# Patient Record
Sex: Female | Born: 1965 | Race: Black or African American | Hispanic: No | Marital: Married | Smoking: Current some day smoker
Health system: Southern US, Community
[De-identification: ages and names within clinical notes are randomized; demographics above are authoritative.]

## PROBLEM LIST (undated history)

## (undated) DIAGNOSIS — I209 Angina pectoris, unspecified: Secondary | ICD-10-CM

## (undated) DIAGNOSIS — F329 Major depressive disorder, single episode, unspecified: Secondary | ICD-10-CM

## (undated) DIAGNOSIS — E785 Hyperlipidemia, unspecified: Secondary | ICD-10-CM

## (undated) DIAGNOSIS — R519 Headache, unspecified: Secondary | ICD-10-CM

## (undated) DIAGNOSIS — R51 Headache: Secondary | ICD-10-CM

## (undated) DIAGNOSIS — M5136 Other intervertebral disc degeneration, lumbar region: Secondary | ICD-10-CM

## (undated) DIAGNOSIS — R0600 Dyspnea, unspecified: Secondary | ICD-10-CM

## (undated) DIAGNOSIS — C561 Malignant neoplasm of right ovary: Secondary | ICD-10-CM

## (undated) DIAGNOSIS — J45909 Unspecified asthma, uncomplicated: Secondary | ICD-10-CM

## (undated) DIAGNOSIS — F419 Anxiety disorder, unspecified: Secondary | ICD-10-CM

## (undated) DIAGNOSIS — K219 Gastro-esophageal reflux disease without esophagitis: Secondary | ICD-10-CM

## (undated) DIAGNOSIS — I219 Acute myocardial infarction, unspecified: Secondary | ICD-10-CM

## (undated) DIAGNOSIS — M1990 Unspecified osteoarthritis, unspecified site: Secondary | ICD-10-CM

## (undated) DIAGNOSIS — I10 Essential (primary) hypertension: Secondary | ICD-10-CM

## (undated) DIAGNOSIS — M109 Gout, unspecified: Secondary | ICD-10-CM

## (undated) DIAGNOSIS — R7303 Prediabetes: Secondary | ICD-10-CM

## (undated) DIAGNOSIS — F32A Depression, unspecified: Secondary | ICD-10-CM

## (undated) DIAGNOSIS — K760 Fatty (change of) liver, not elsewhere classified: Secondary | ICD-10-CM

## (undated) DIAGNOSIS — K449 Diaphragmatic hernia without obstruction or gangrene: Secondary | ICD-10-CM

## (undated) DIAGNOSIS — M51369 Other intervertebral disc degeneration, lumbar region without mention of lumbar back pain or lower extremity pain: Secondary | ICD-10-CM

## (undated) DIAGNOSIS — Z87442 Personal history of urinary calculi: Secondary | ICD-10-CM

## (undated) DIAGNOSIS — K644 Residual hemorrhoidal skin tags: Secondary | ICD-10-CM

## (undated) DIAGNOSIS — R0609 Other forms of dyspnea: Secondary | ICD-10-CM

## (undated) DIAGNOSIS — R7989 Other specified abnormal findings of blood chemistry: Secondary | ICD-10-CM

## (undated) DIAGNOSIS — R001 Bradycardia, unspecified: Secondary | ICD-10-CM

## (undated) DIAGNOSIS — Z87898 Personal history of other specified conditions: Secondary | ICD-10-CM

## (undated) HISTORY — PX: CARDIAC CATHETERIZATION: SHX172

## (undated) HISTORY — PX: LAPAROSCOPIC REMOVAL ABDOMINAL MASS: SHX6360

## (undated) HISTORY — PX: ABDOMINAL HYSTERECTOMY: SHX81

---

## 2007-07-09 ENCOUNTER — Ambulatory Visit: Payer: Self-pay | Admitting: Family Medicine

## 2007-07-09 IMAGING — US ABDOMEN ULTRASOUND
1 series · 17 of 25 positions shown · non-contrast
Comparison: none

REASON FOR EXAM: Abd Pain RUQ
COMMENTS:

PROCEDURE:     US  - US ABDOMEN GENERAL SURVEY  - [DATE]  [DATE]
RESULT:     Comparison: No available comparison exam.

[Series 1: abdomen ultrasound · 17 of 57 slices shown]
[im 1/57]
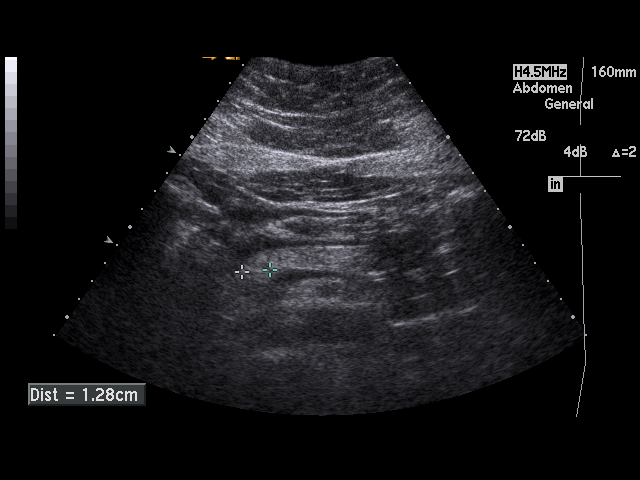
[im 5/57]
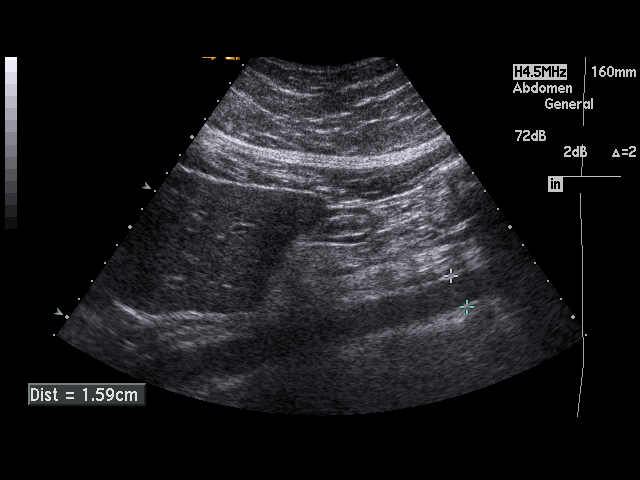
[im 8/57]
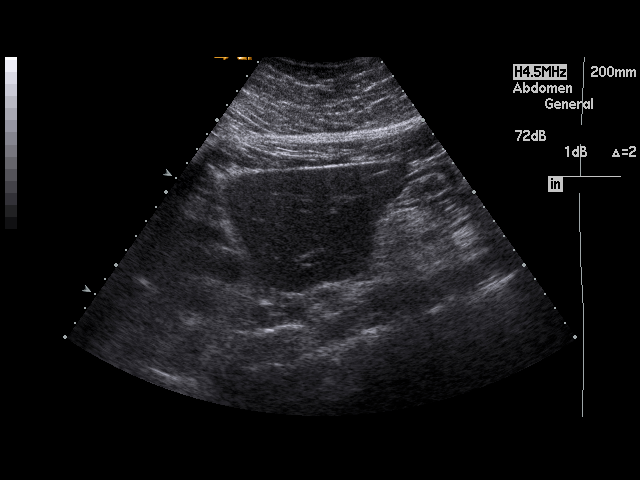
[im 12/57]
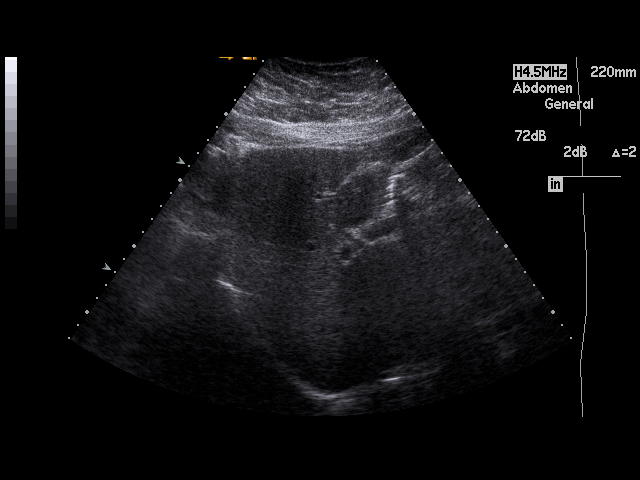
[im 15/57]
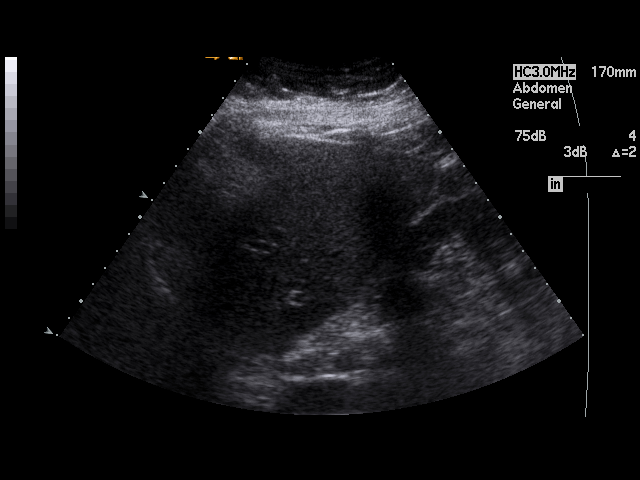
[im 19/57]
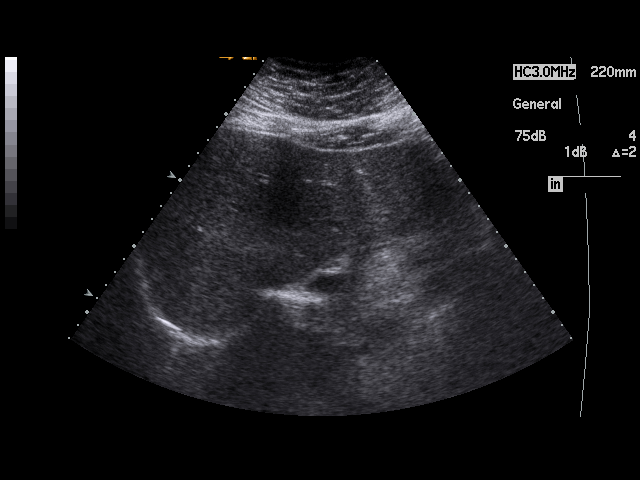
[im 22/57]
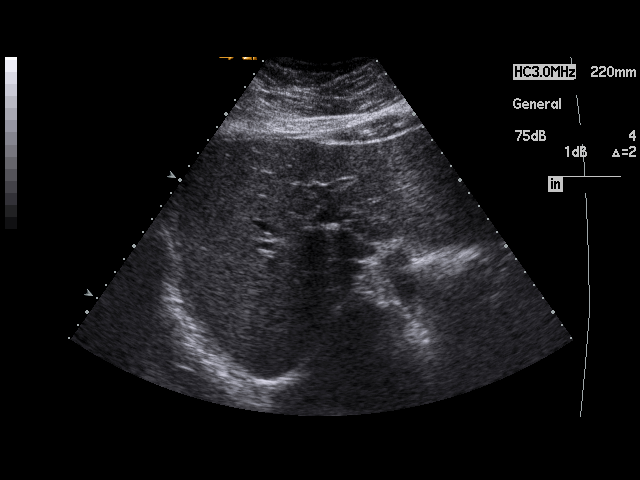
[im 26/57]
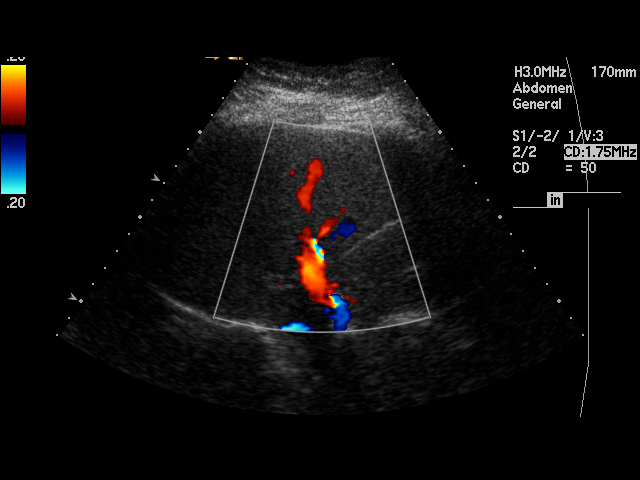
[im 29/57]
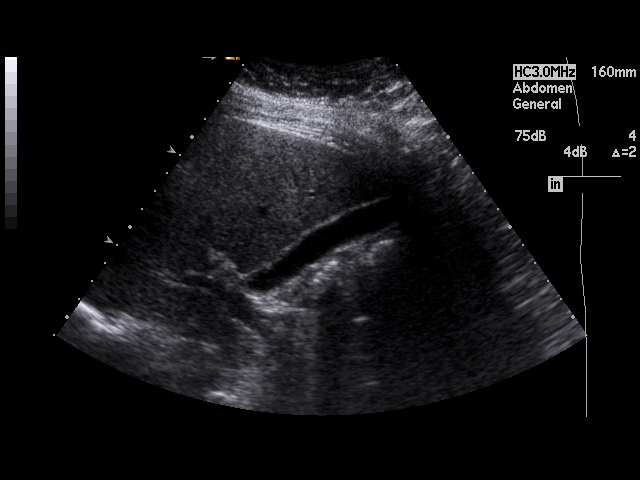
[im 31/57]
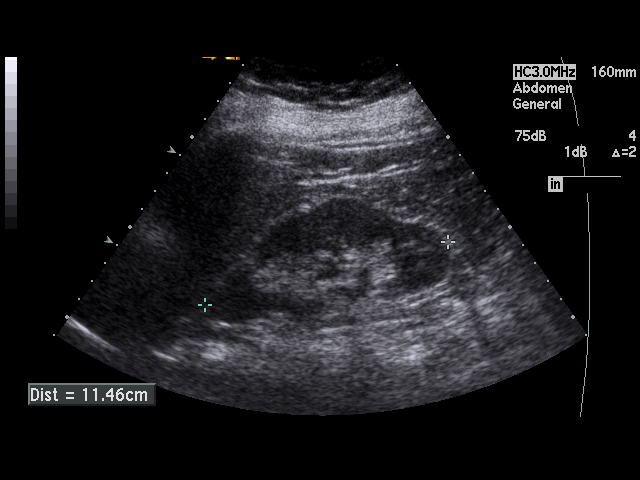
[im 36/57]
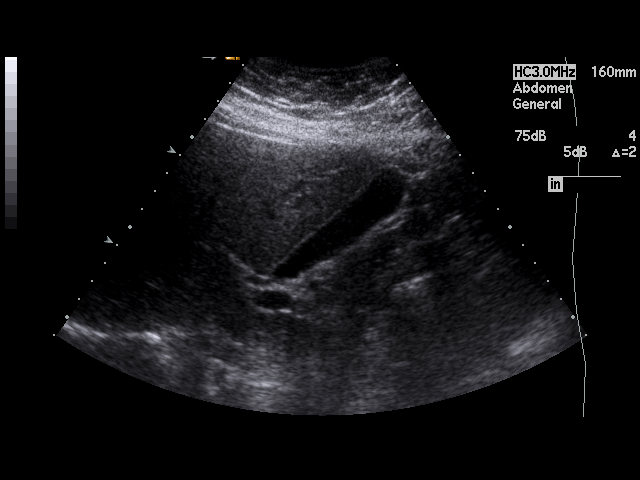
[im 38/57]
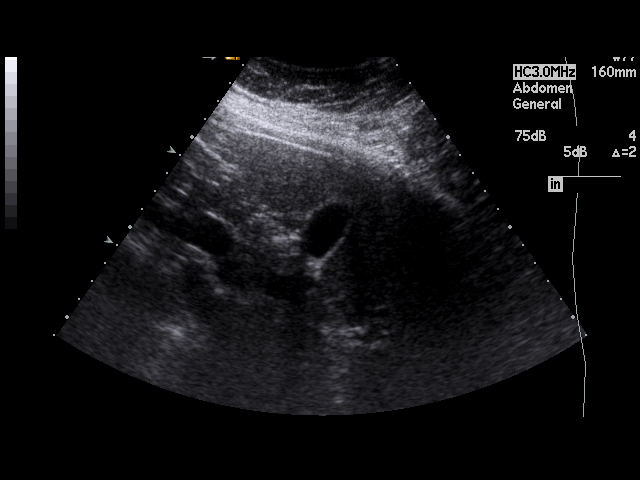
[im 43/57]
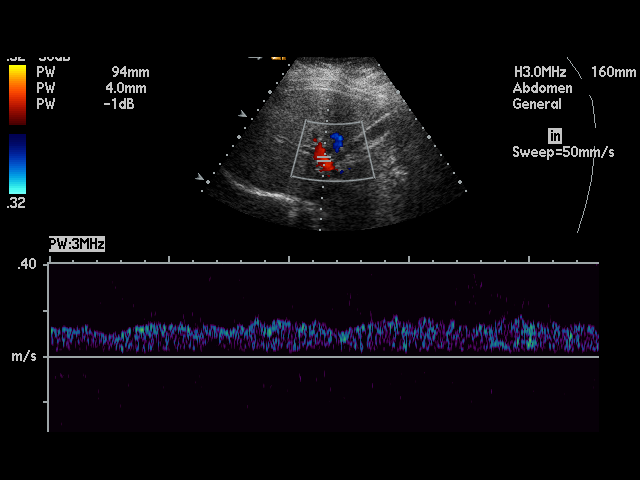
[im 45/57]
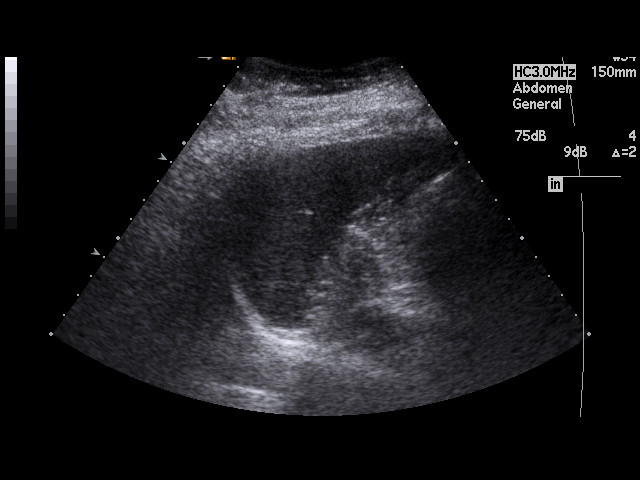
[im 50/57]
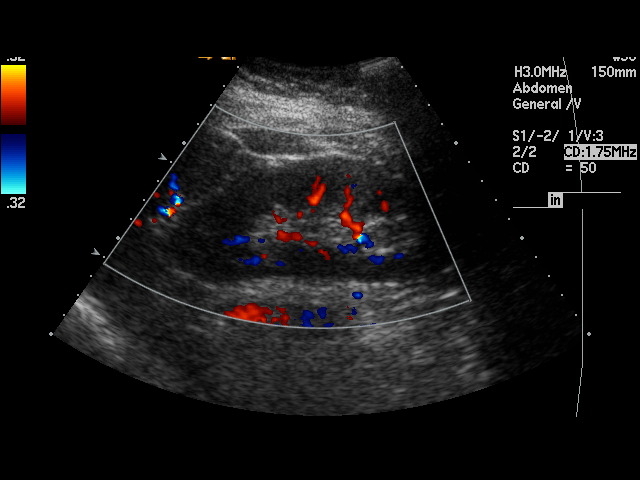
[im 52/57]
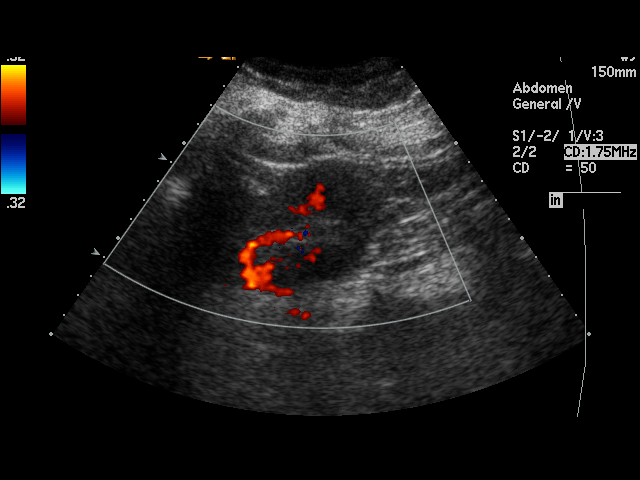
[im 57/57]
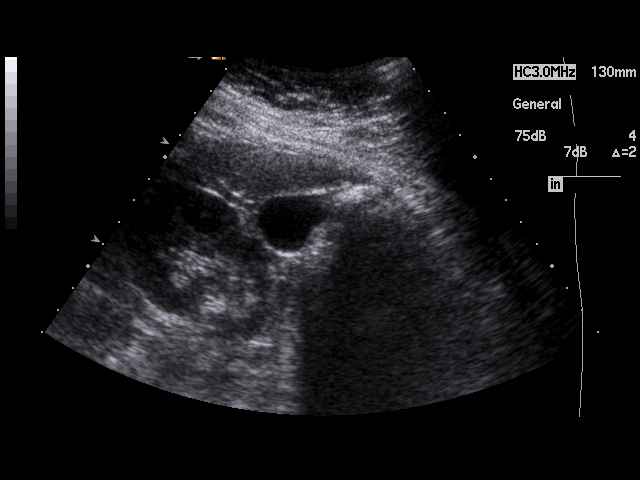

[17 of 25 positions shown; findings below may reference images not displayed]

FINDINGS: The liver is unremarkable without evidence of a focal mass. There is no
intra or extrahepatic bile duct dilatation. The common duct measures 3 mm in
diameter. The gallbladder is unremarkable. There is no sonographic Murphy's
sign. The main portal vein is patent with hepatopedal flow.

The partially visualized pancreatic head and body are unremarkable. The
right kidney measures 11.5 cm and the left measures 11.7 cm in length. There
is normal corticomedullary differentiation. No definite renal stone or mass
is noted. There is no hydronephrosis. The spleen measures 10.6 cm in length.
No evidence of abdominal aortic aneurysm.
IMPRESSION: 1. The pancreas is not fully visualized. Otherwise, unremarkable abdominal
sonogram.

## 2008-01-04 ENCOUNTER — Emergency Department: Payer: Self-pay | Admitting: Emergency Medicine

## 2009-06-05 ENCOUNTER — Ambulatory Visit: Payer: Self-pay | Admitting: Internal Medicine

## 2009-06-05 IMAGING — CT CT ABD-PELV W/ CM
1 of 3 series · 12 of 32 positions shown, 18 images · IV contrast (isovue)
Comparison: None

REASON FOR EXAM: rt abd pain nausea lower back pain
COMMENTS:

PROCEDURE:     CT  - CT ABDOMEN / PELVIS  W  - [DATE]  [DATE]
RESULT:     History: Right abdominal pain
TECHNIQUE: Multiple axial images of the abdomen and pelvis were performed
from the lung bases to the pubic symphysis, with p.o. contrast and with 100
ml of Isovue 370 intravenous contrast.

[Series 2: abdomen · axial · 0.83mm/px · z∈[+139,+524]mm · 12 of 93 slices shown, 18 images]
[im 8/93  soft-tissue]
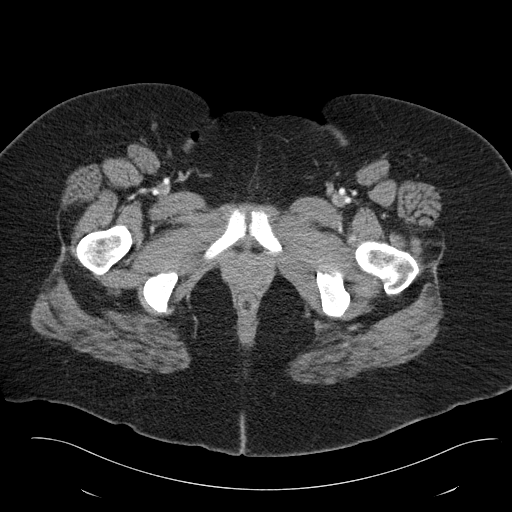
[im 8/93  bone]
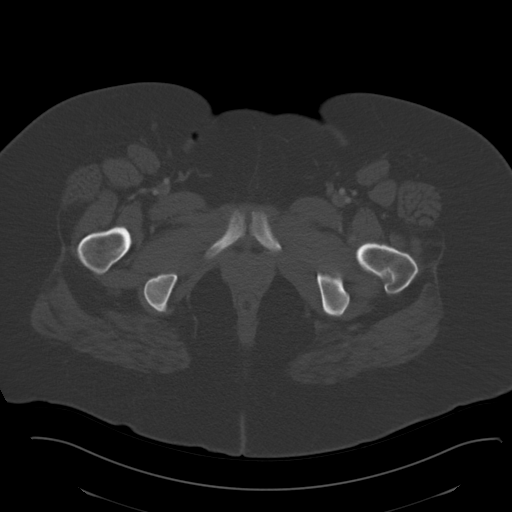
[im 15/93  soft-tissue]
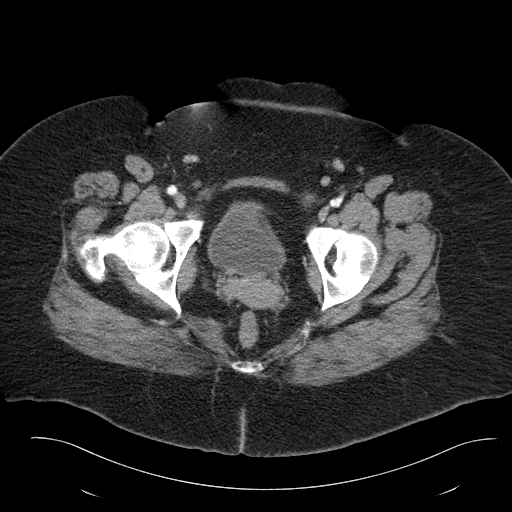
[im 22/93  soft-tissue]
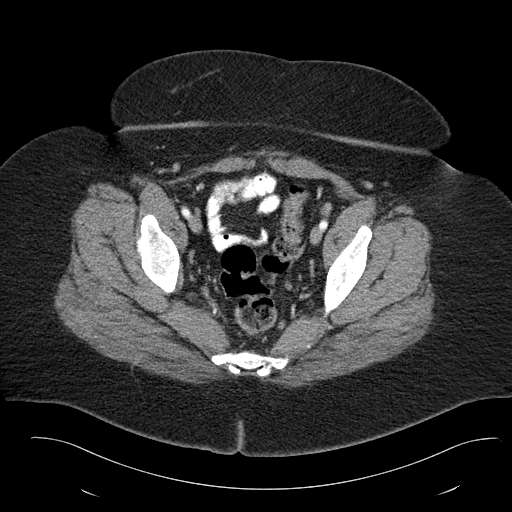
[im 29/93  soft-tissue]
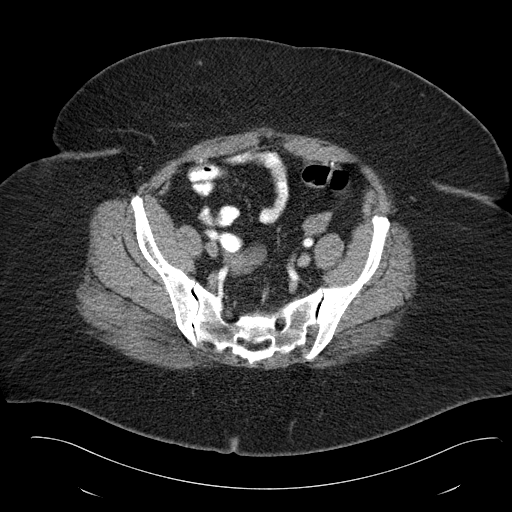
[im 36/93  soft-tissue]
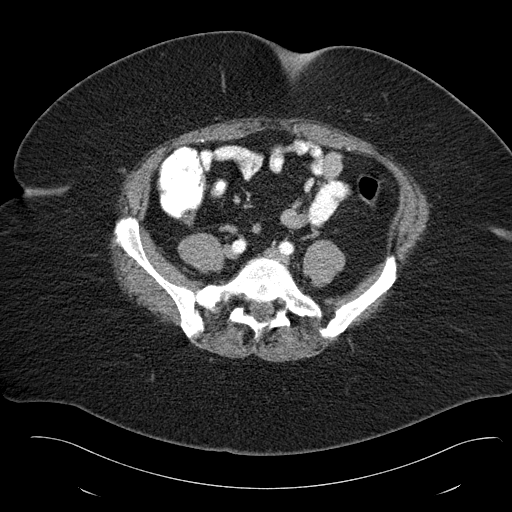
[im 43/93  soft-tissue]
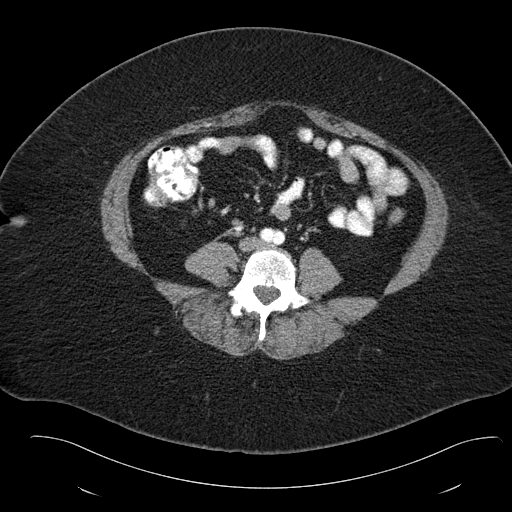
[im 50/93  soft-tissue]
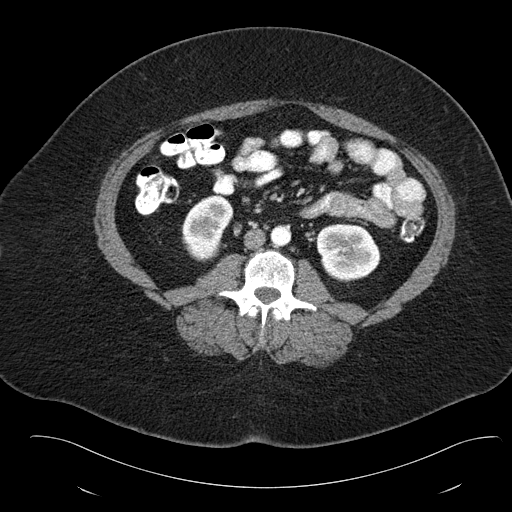
[im 57/93  soft-tissue]
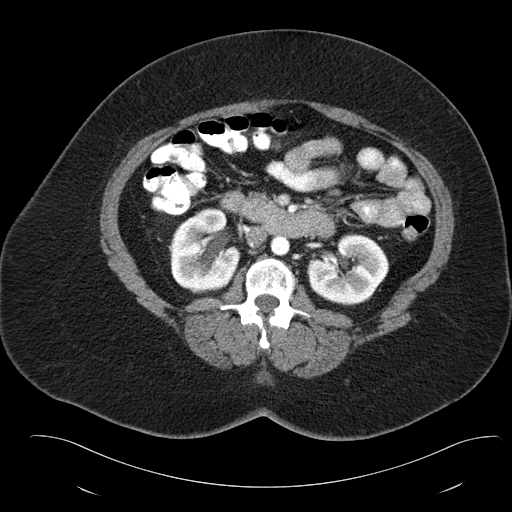
[im 64/93  soft-tissue]
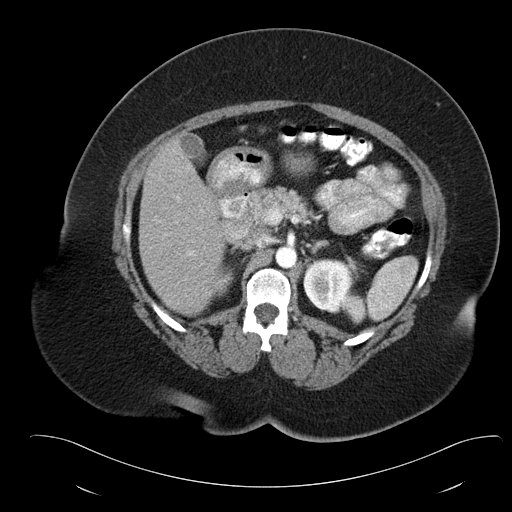
[im 64/93  lung]
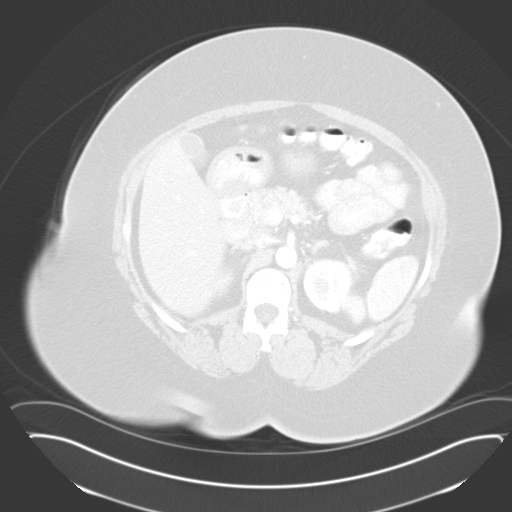
[im 64/93  bone]
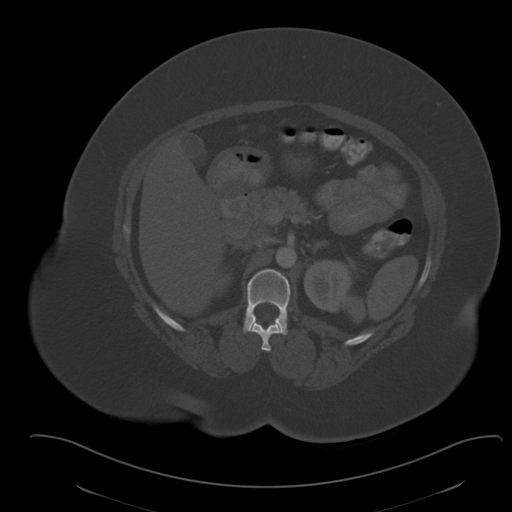
[im 71/93  soft-tissue]
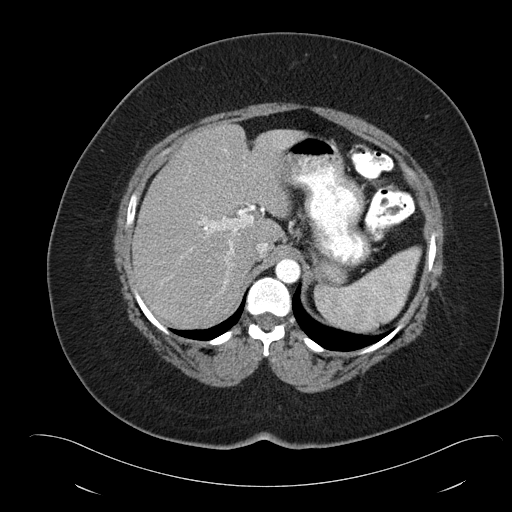
[im 71/93  lung]
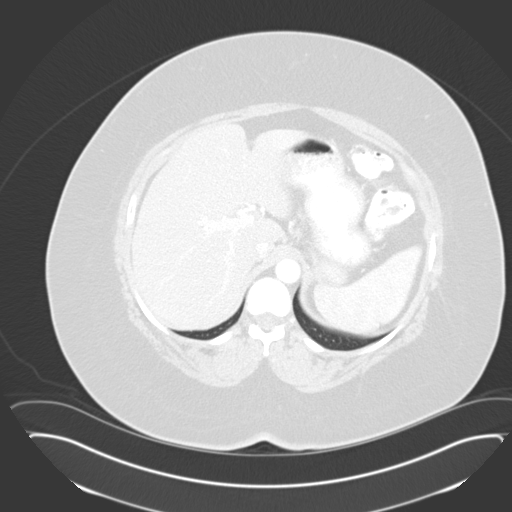
[im 78/93  soft-tissue]
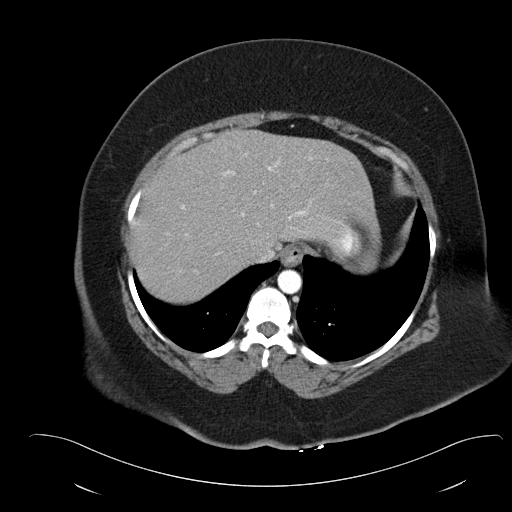
[im 78/93  lung]
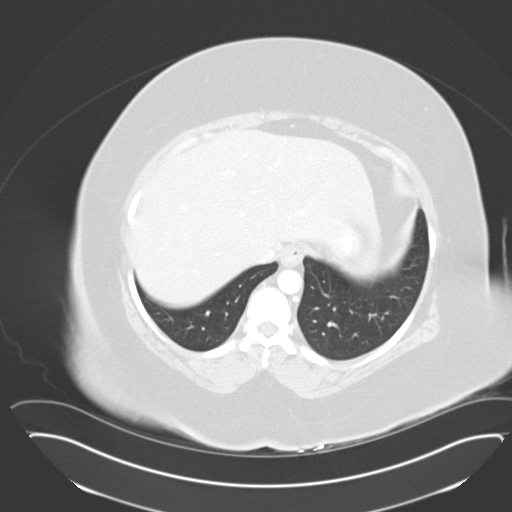
[im 85/93  soft-tissue]
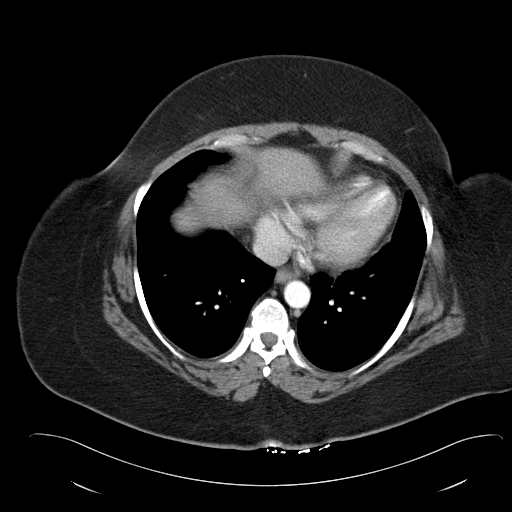
[im 85/93  lung]
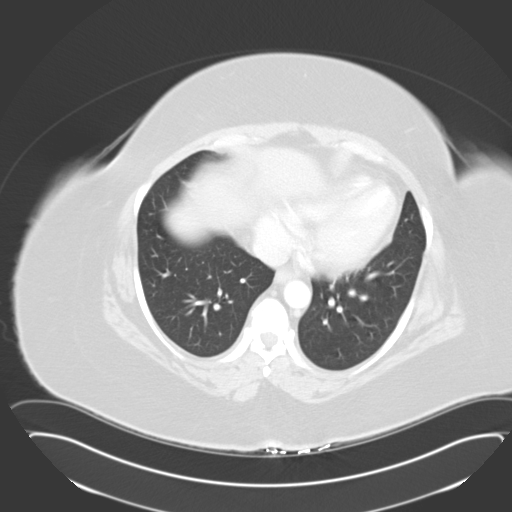

[12 of 32 positions shown; findings below may reference images not displayed]

FINDINGS: The lung bases are clear. There is no pneumothorax. The heart size is
normal.

The liver demonstrates no focal abnormality. There is no intrahepatic or
extrahepatic biliary ductal dilatation. The gallbladder is unremarkable. The
spleen demonstrates no focal abnormality. The kidneys, adrenal glands, and
pancreas are normal. The bladder is unremarkable. There is a nonobstructing
2 mm proximal right ureteral calculus.

The stomach, duodenum, small intestine, and large intestine demonstrate no
contrast extravasation or dilatation. There is a normal caliber appendix in
the right lower quadrant without periappendiceal inflammatory changes. There
is no pneumoperitoneum, pneumatosis, or portal venous gas. There is no
abdominal or pelvic free fluid. There is no lymphadenopathy.

The abdominal aorta is normal in caliber .

There is lumbar spine spondylosis.
IMPRESSION: 1. Nonobstructing 2 mm proximal right ureteral calculus.

## 2009-07-17 ENCOUNTER — Ambulatory Visit: Payer: Self-pay | Admitting: Unknown Physician Specialty

## 2009-11-21 ENCOUNTER — Ambulatory Visit: Payer: Self-pay | Admitting: Family Medicine

## 2009-11-21 IMAGING — MG MAM DGTL SCREENING MAMMO W/CAD
1 series · 4 of 4 positions shown · non-contrast
Comparison: none

REASON FOR EXAM: scr  unsure if baseline
COMMENTS:

PROCEDURE:     MAM - MAM DGTL SCREENING MAMMO W/CAD  - [DATE]  [DATE]
RESULT:      No dominant masses or pathologic clustered calcifications are
demonstrated.  Exam is stable from prior exams. CAD evaluation is nonfocal.

[R CC · right · 4 of 4 slices shown]
[im 1/4]
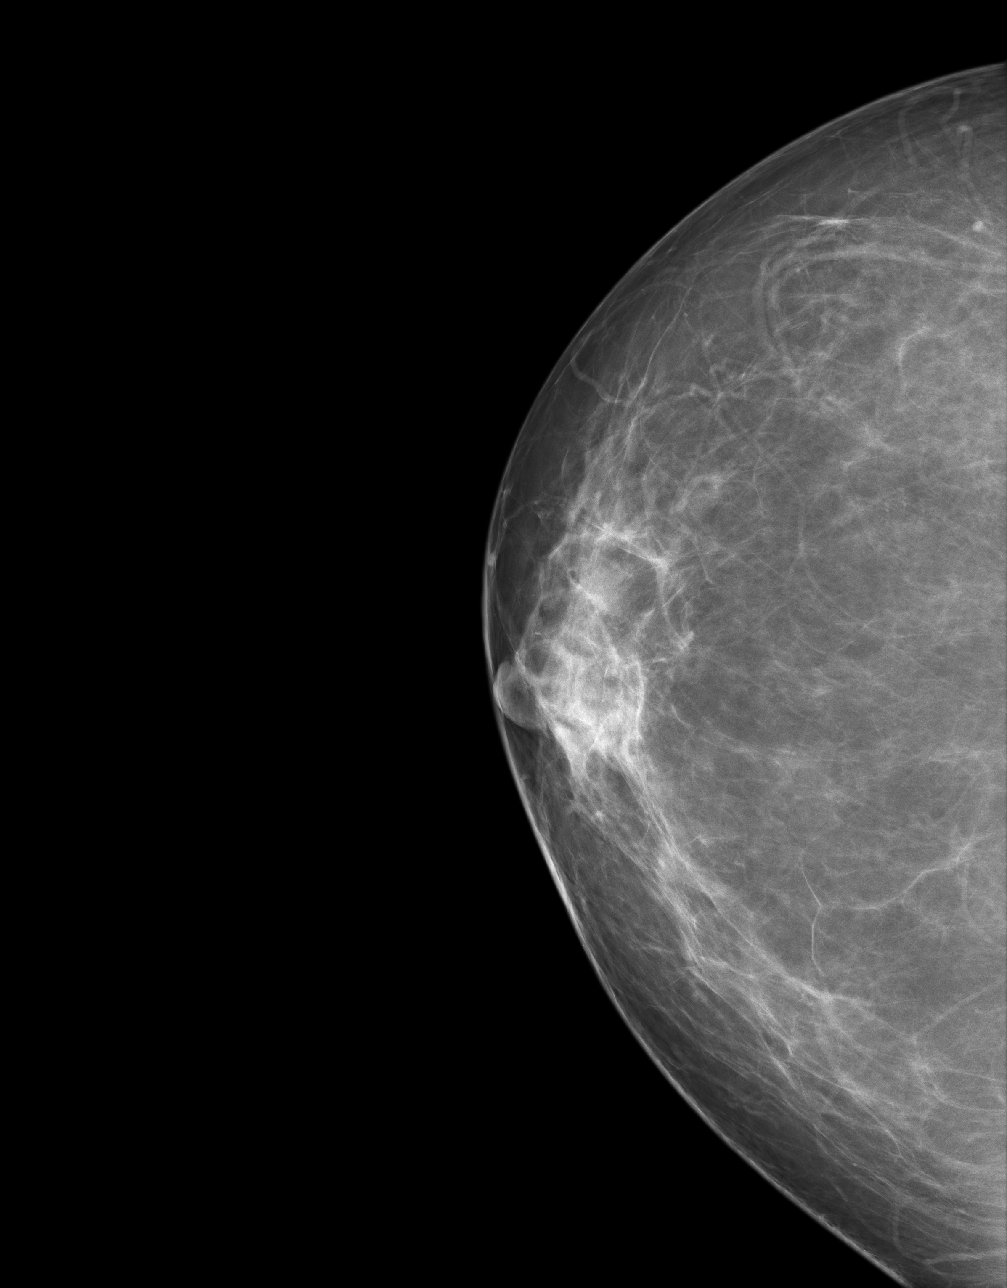
[im 2/4]
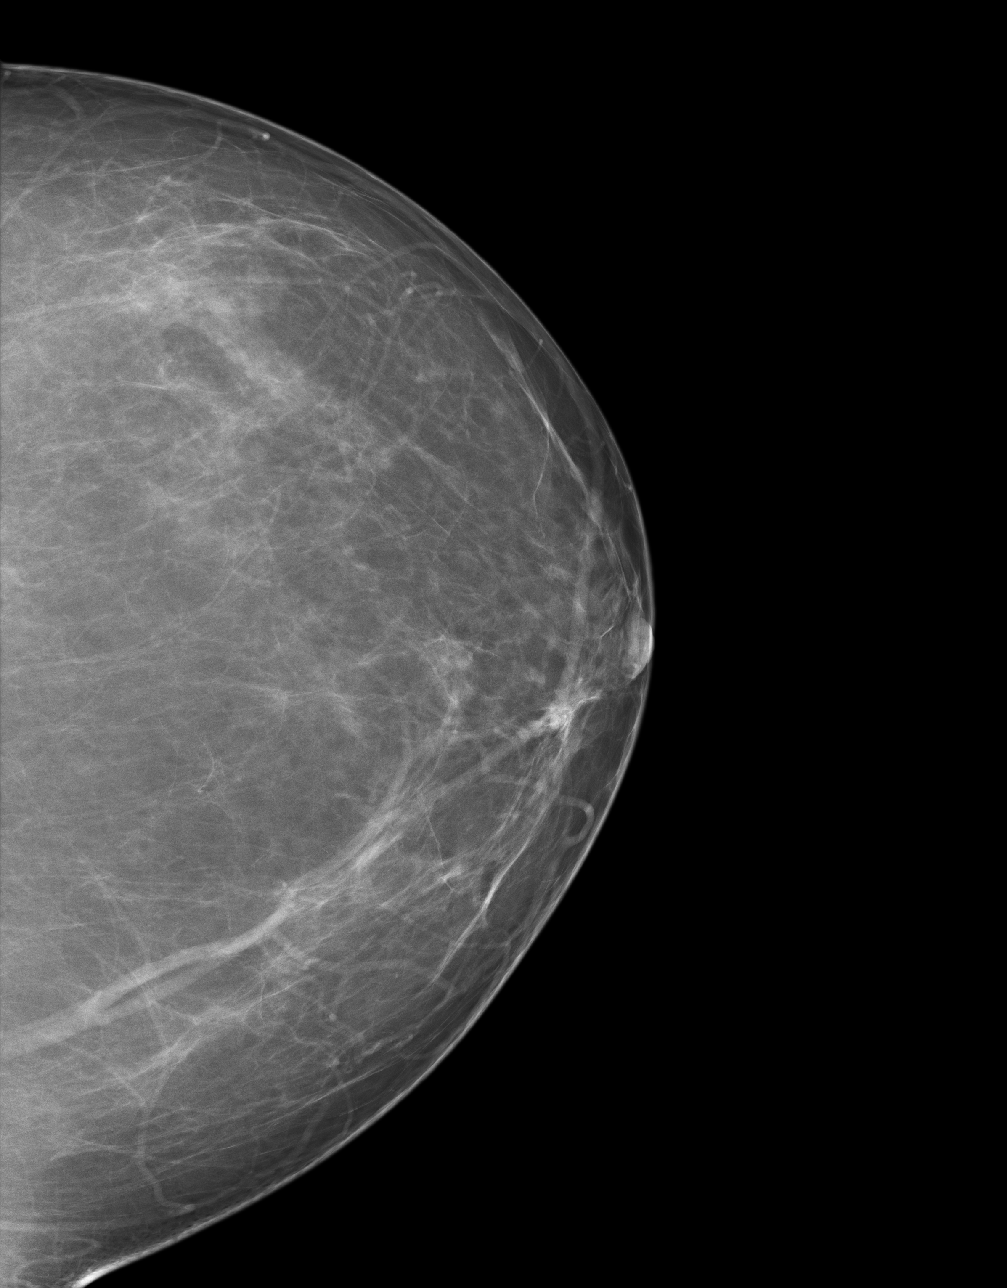
[im 3/4]
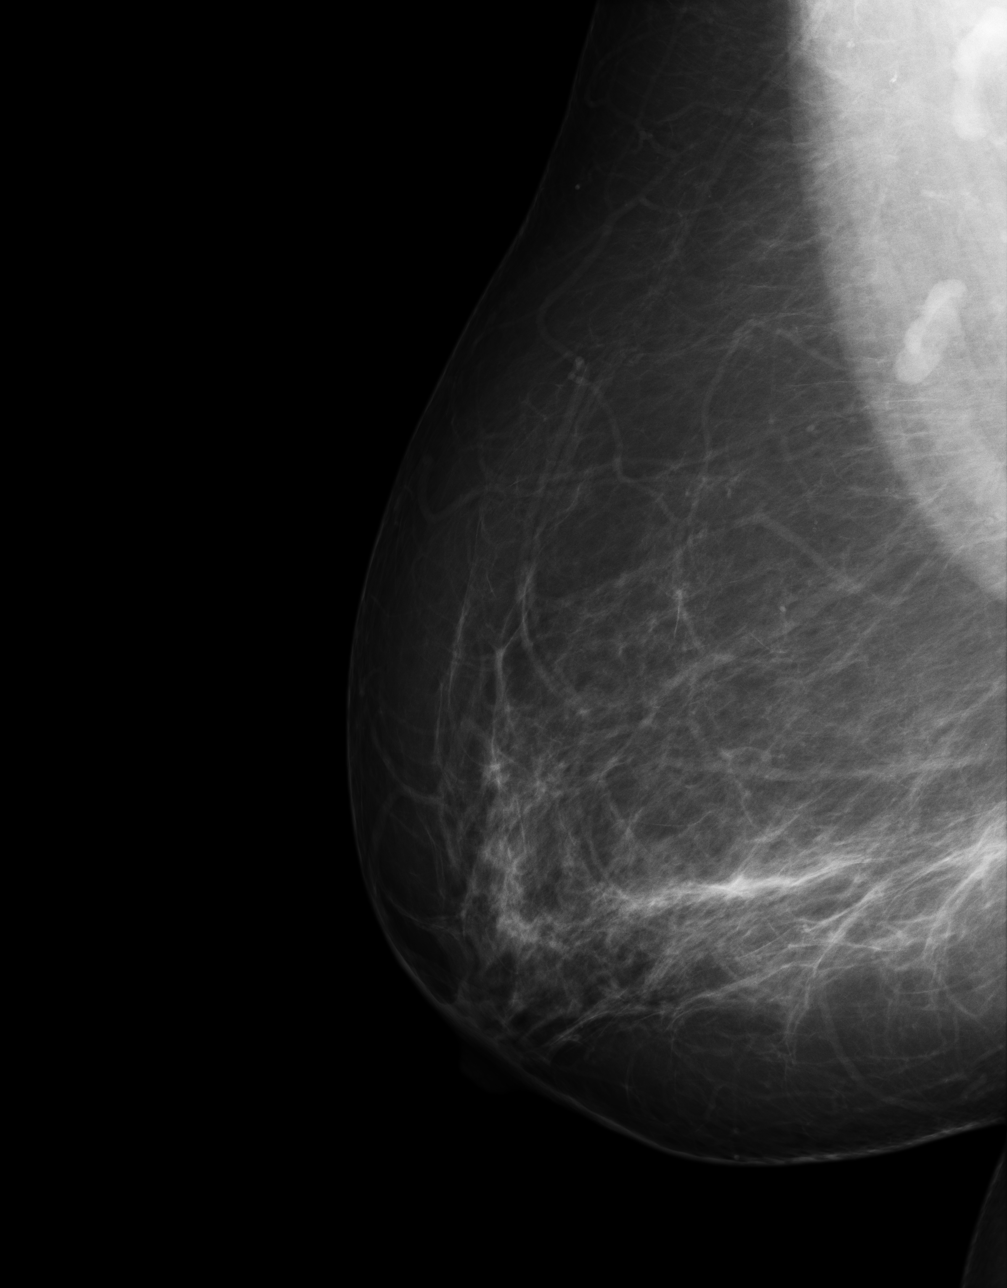
[im 4/4]
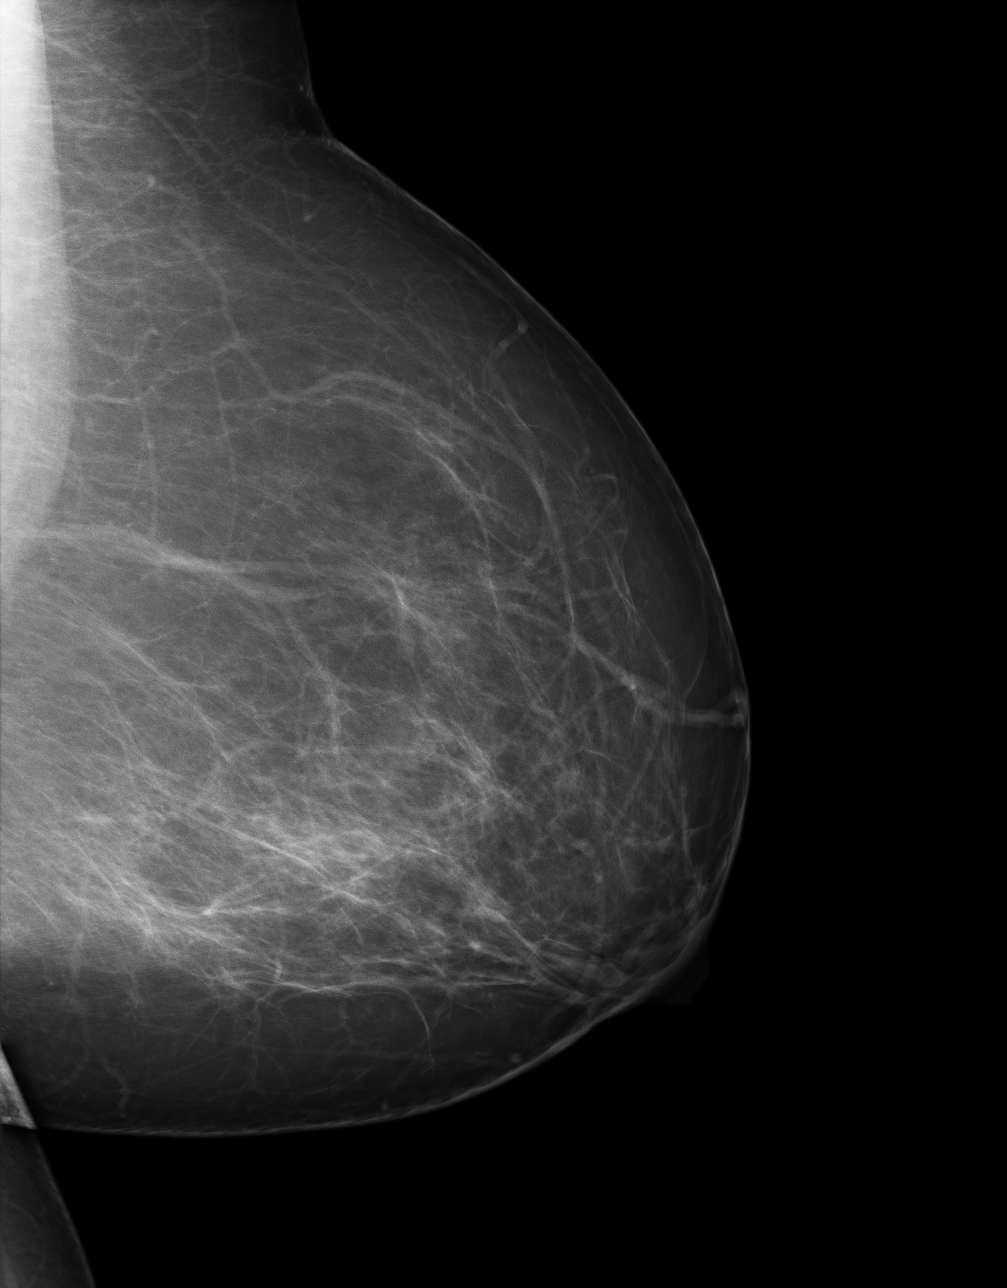

[4 of 4 positions shown; findings below may reference images not displayed]

IMPRESSION: 1.     Benign exam.
2.     Routine yearly follow up exam is suggested.
3.     BI-RADS:  Category 2- Benign Finding.

A negative mammogram report does not preclude biopsy or other evaluation of
a clinically palpable or otherwise suspicious mass or lesion. Breast cancer
may not be detected by mammography in up to 10% of cases.

## 2011-06-19 DIAGNOSIS — R7989 Other specified abnormal findings of blood chemistry: Secondary | ICD-10-CM | POA: Insufficient documentation

## 2011-07-18 ENCOUNTER — Ambulatory Visit: Payer: Self-pay | Admitting: General Surgery

## 2011-07-18 DIAGNOSIS — I10 Essential (primary) hypertension: Secondary | ICD-10-CM

## 2011-07-21 LAB — PATHOLOGY REPORT

## 2011-12-16 ENCOUNTER — Ambulatory Visit: Payer: Self-pay | Admitting: Family Medicine

## 2011-12-16 IMAGING — MG MM DIGITAL SCREENING BILAT W/ CAD
1 series · 8 of 8 positions shown · non-contrast
Comparison: none

REASON FOR EXAM: scr mammo no order
COMMENTS:

PROCEDURE:     MMM - MMM DGT SCR NO ORDER W/CAD  - [DATE]  [DATE]
RESULT:
Comparisons: [DATE], mammograms from [REDACTED]
[DATE].

[R CC · right · 8 of 8 slices shown]
[im 1/8]
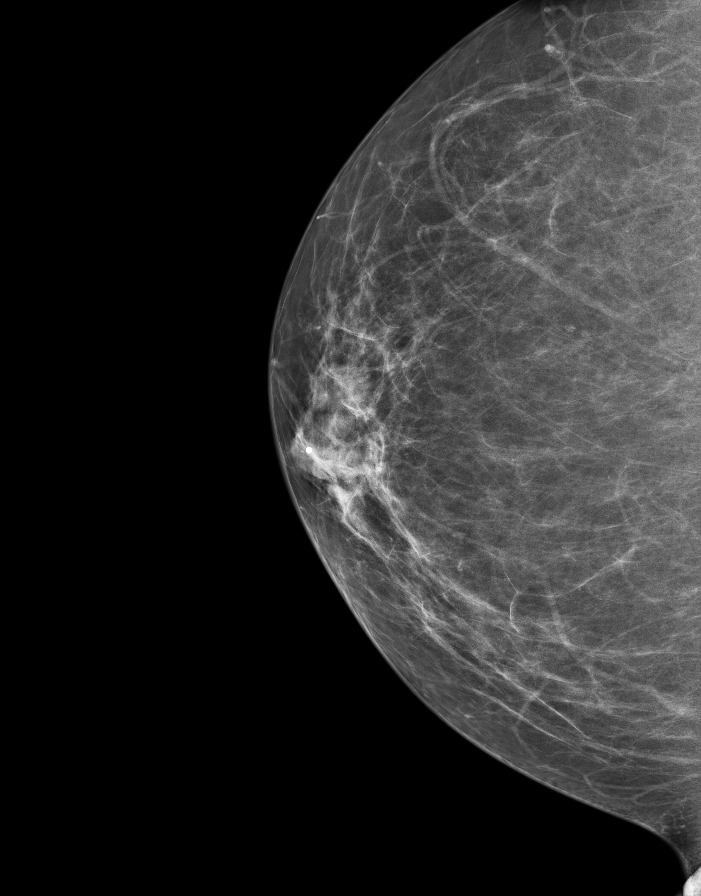
[im 2/8]
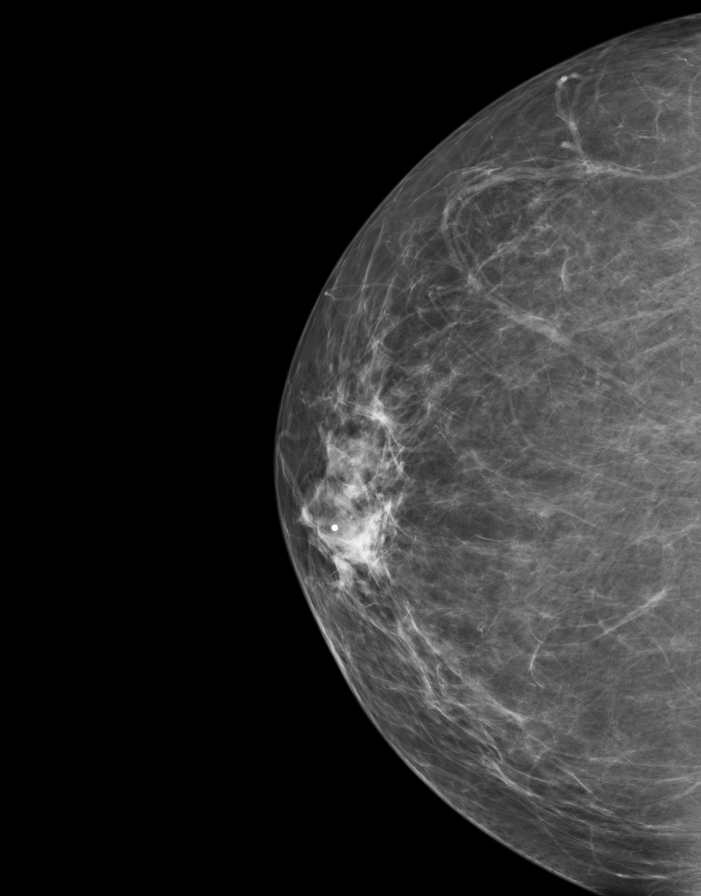
[im 3/8]
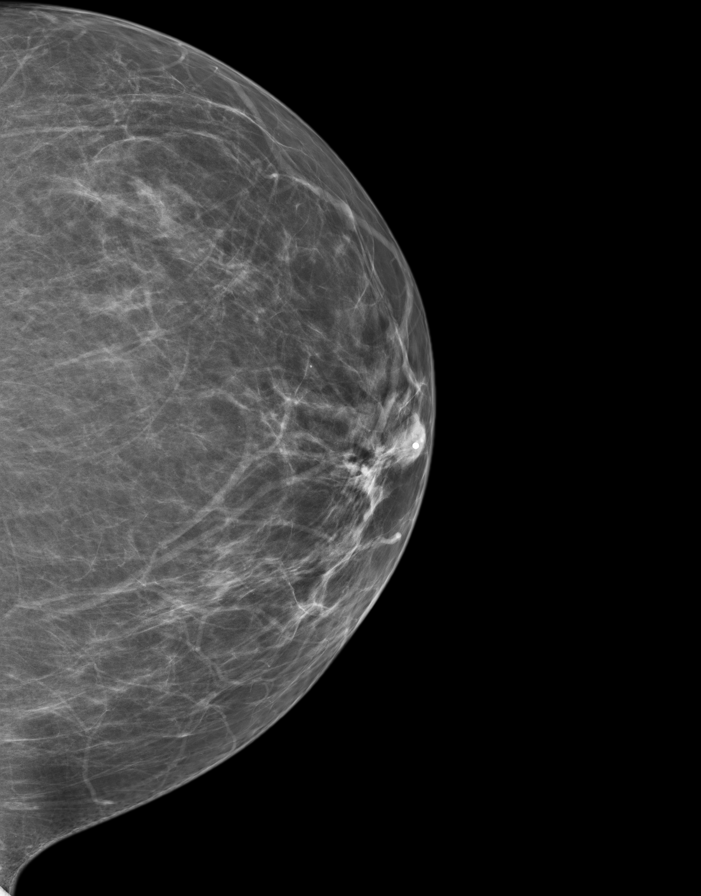
[im 4/8]
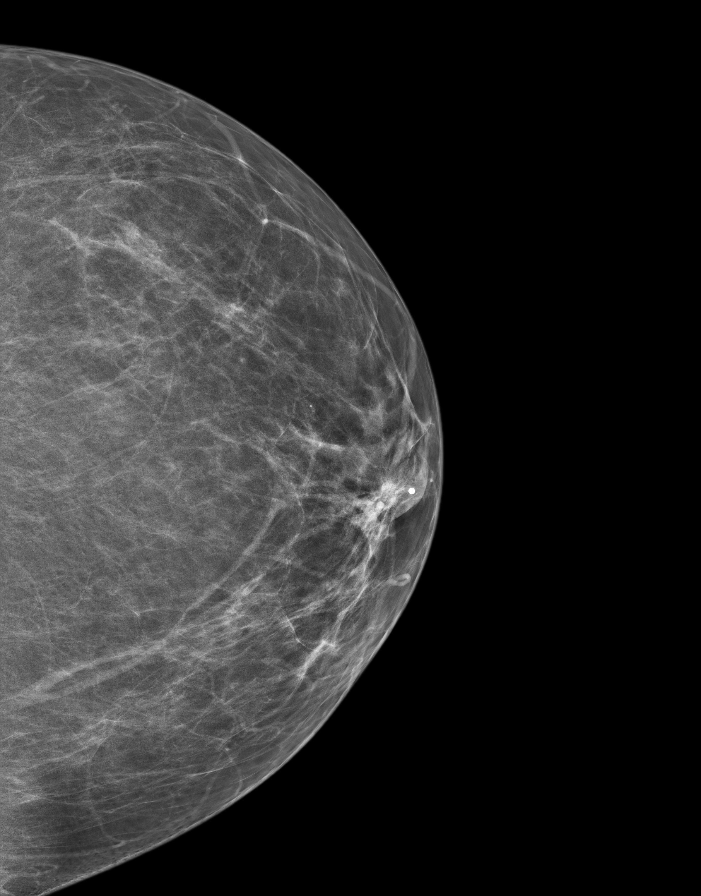
[im 5/8]
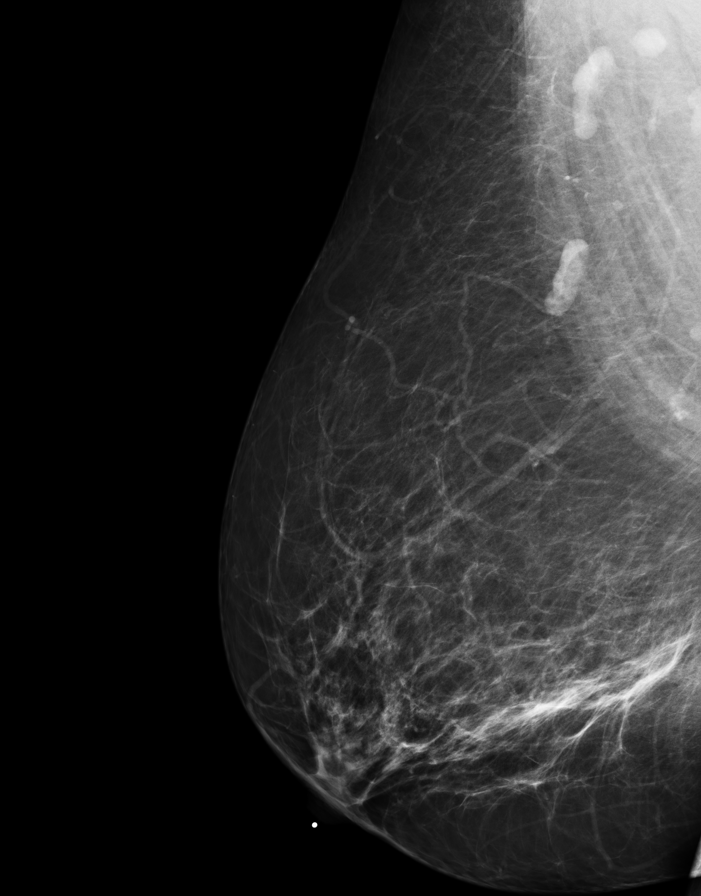
[im 6/8]
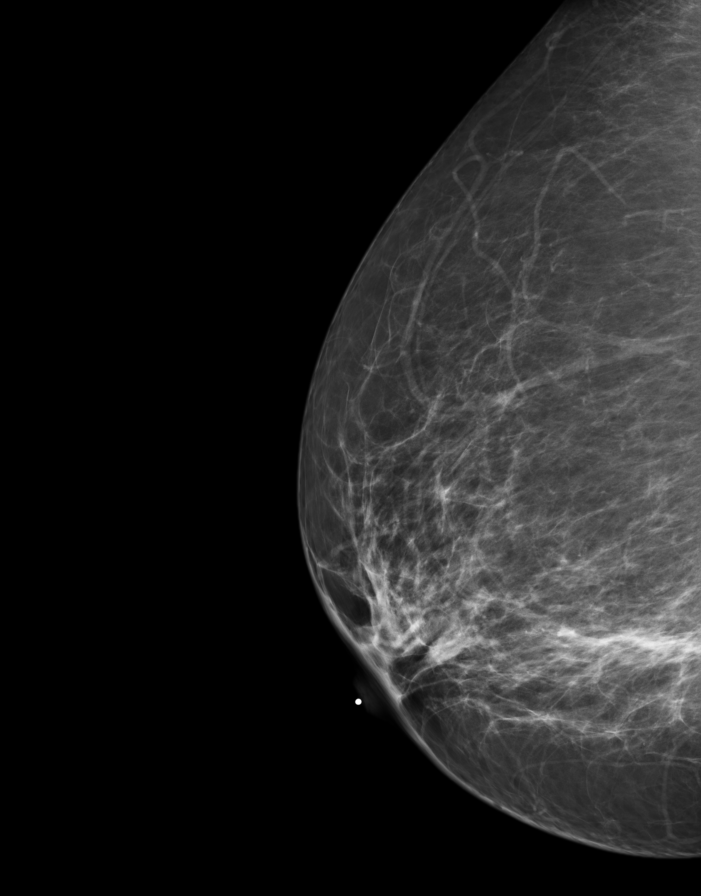
[im 7/8]
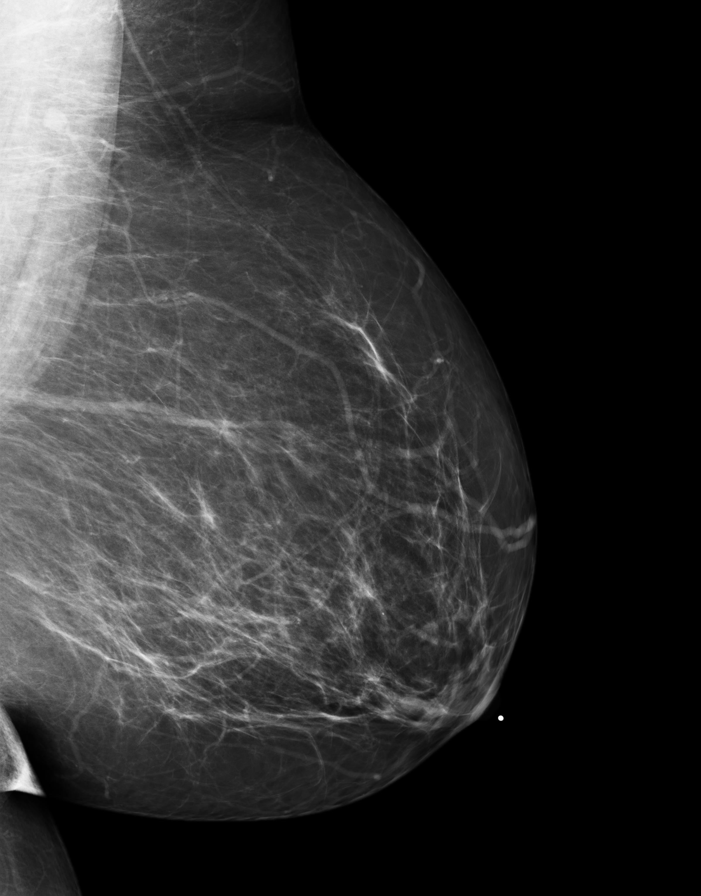
[im 8/8]
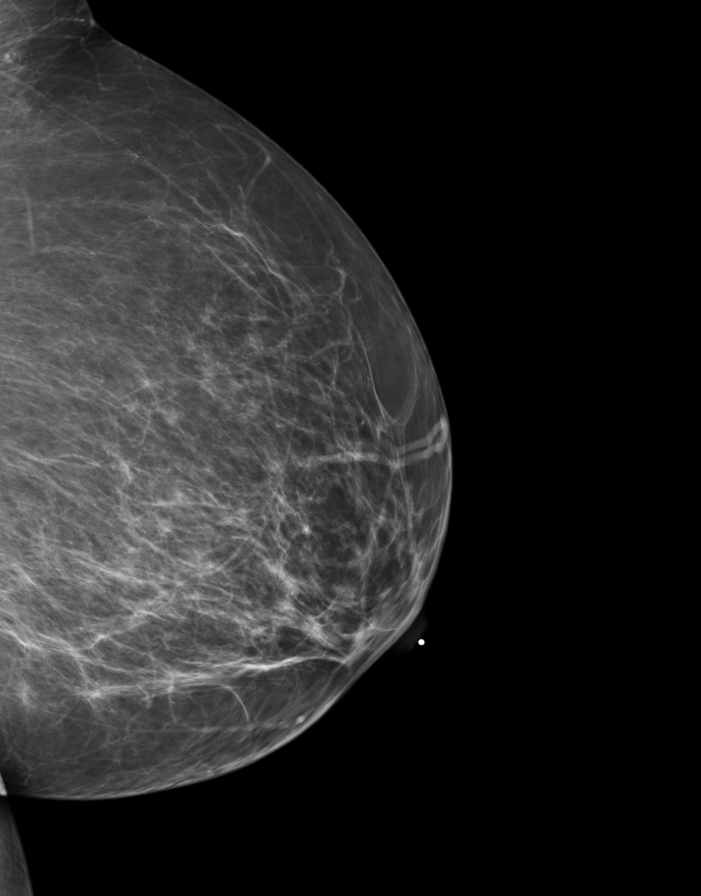

[8 of 8 positions shown; findings below may reference images not displayed]

FINDINGS: There is scattered fibroglandular tissue. No suspicious masses or
calcifications are identified. No areas of architectural distortion.
IMPRESSION: BI-RADS: Category 1 - Negative

Recommend continued annual screening mammography.

A NEGATIVE MAMMOGRAM REPORT DOES NOT PRECLUDE BIOPSY OR OTHER EVALUATION OF
A CLINICALLY PALPABLE OR OTHERWISE SUSPICIOUS MASS OR LESION. BREAST CANCER
MAY NOT BE DETECTED BY MAMMOGRAPHY IN UP TO 10% OF CASES.

## 2012-07-26 DIAGNOSIS — M1710 Unilateral primary osteoarthritis, unspecified knee: Secondary | ICD-10-CM | POA: Insufficient documentation

## 2012-07-26 DIAGNOSIS — M179 Osteoarthritis of knee, unspecified: Secondary | ICD-10-CM | POA: Insufficient documentation

## 2014-02-22 ENCOUNTER — Ambulatory Visit: Payer: Self-pay | Admitting: Family Medicine

## 2014-02-22 IMAGING — MG MM DIGITAL SCREENING BILAT W/ CAD
6 series · 6 of 6 positions shown · non-contrast
Comparison: Previous exam(s).

CLINICAL DATA: Screening.

EXAM:
DIGITAL SCREENING BILATERAL MAMMOGRAM WITH CAD

[L XCCL]
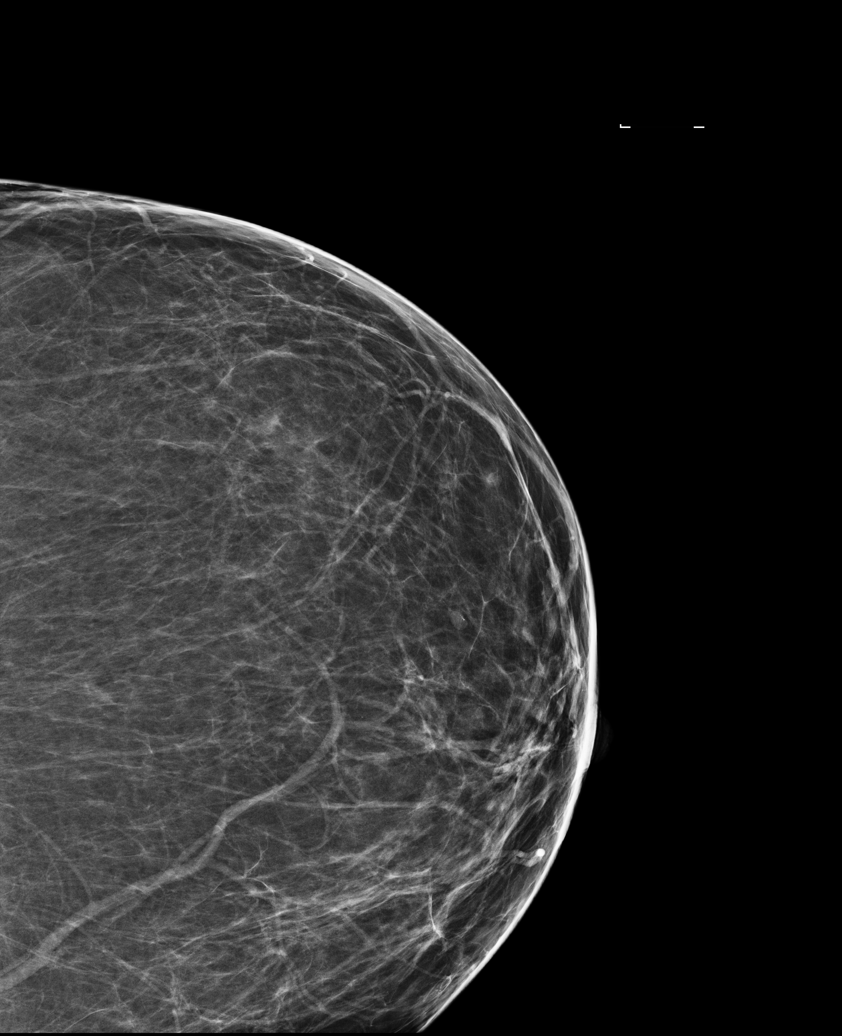

[L CC]
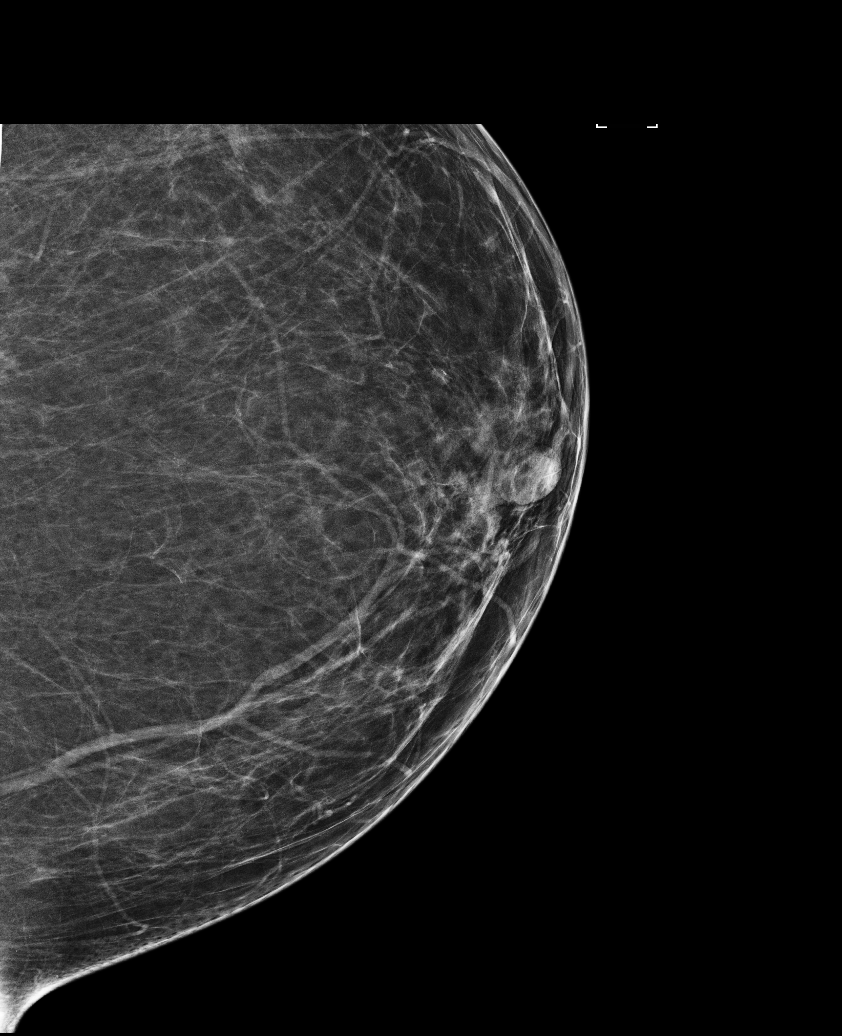

[R MLO]
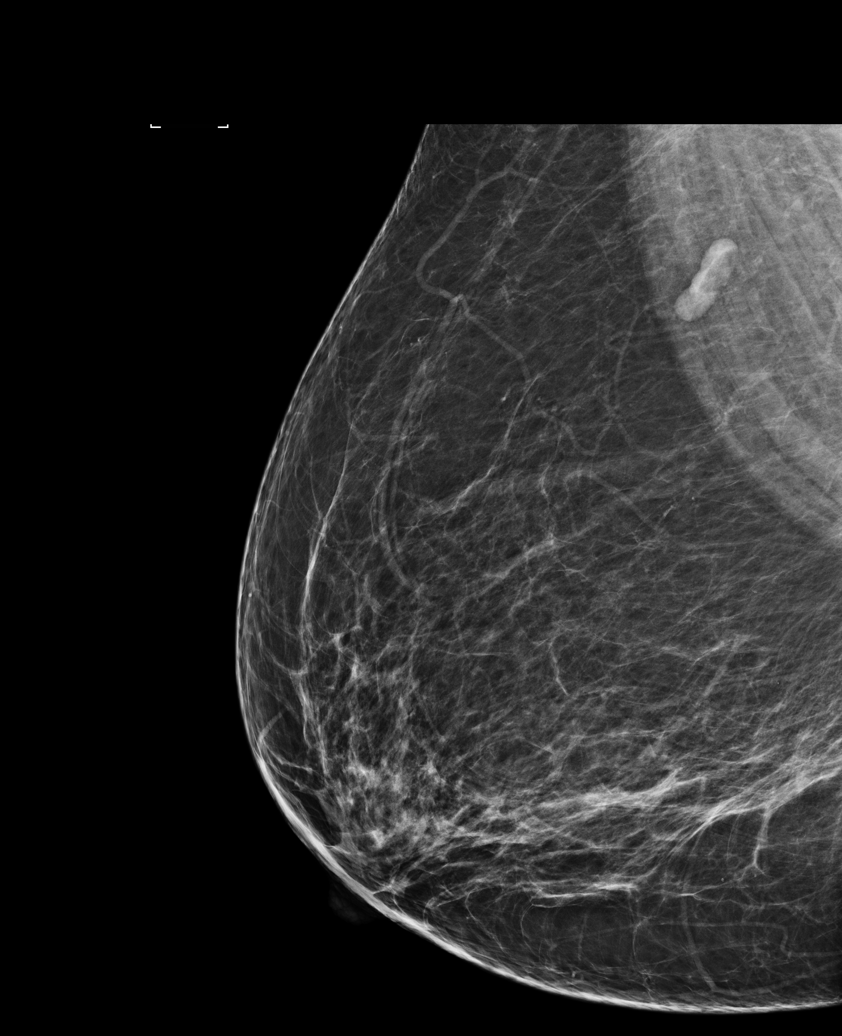

[L MLO]
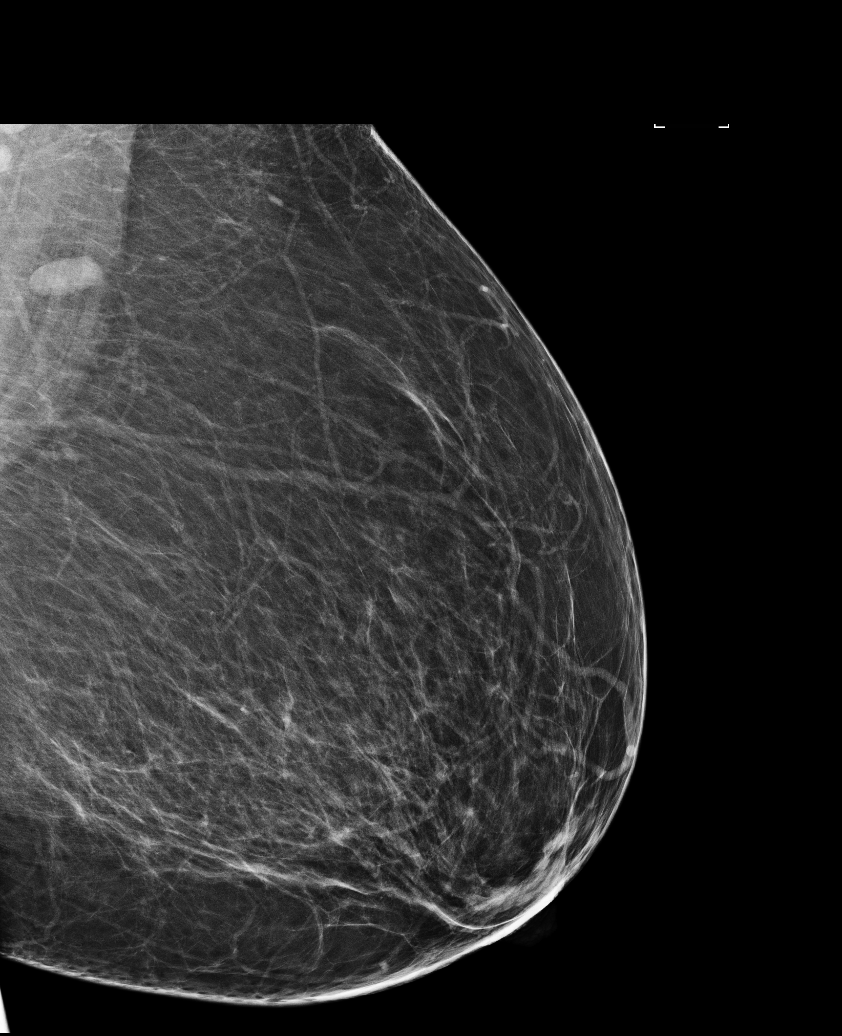

[R XCCL]
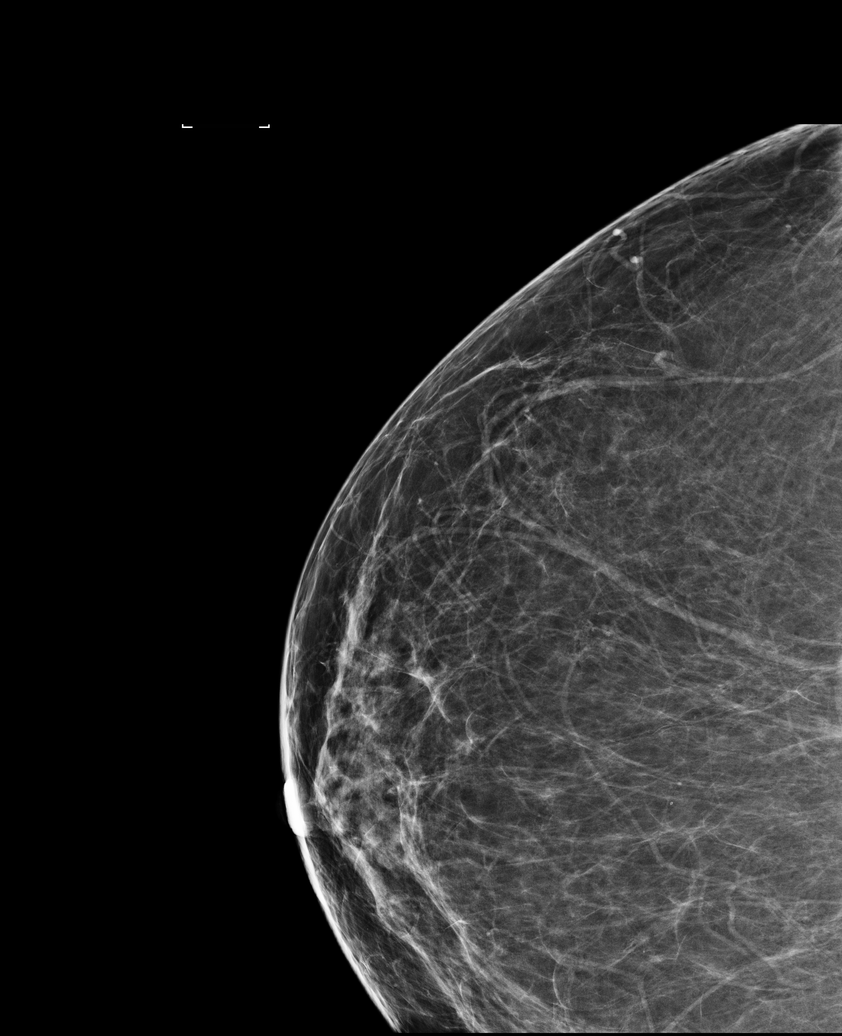

[R CC]
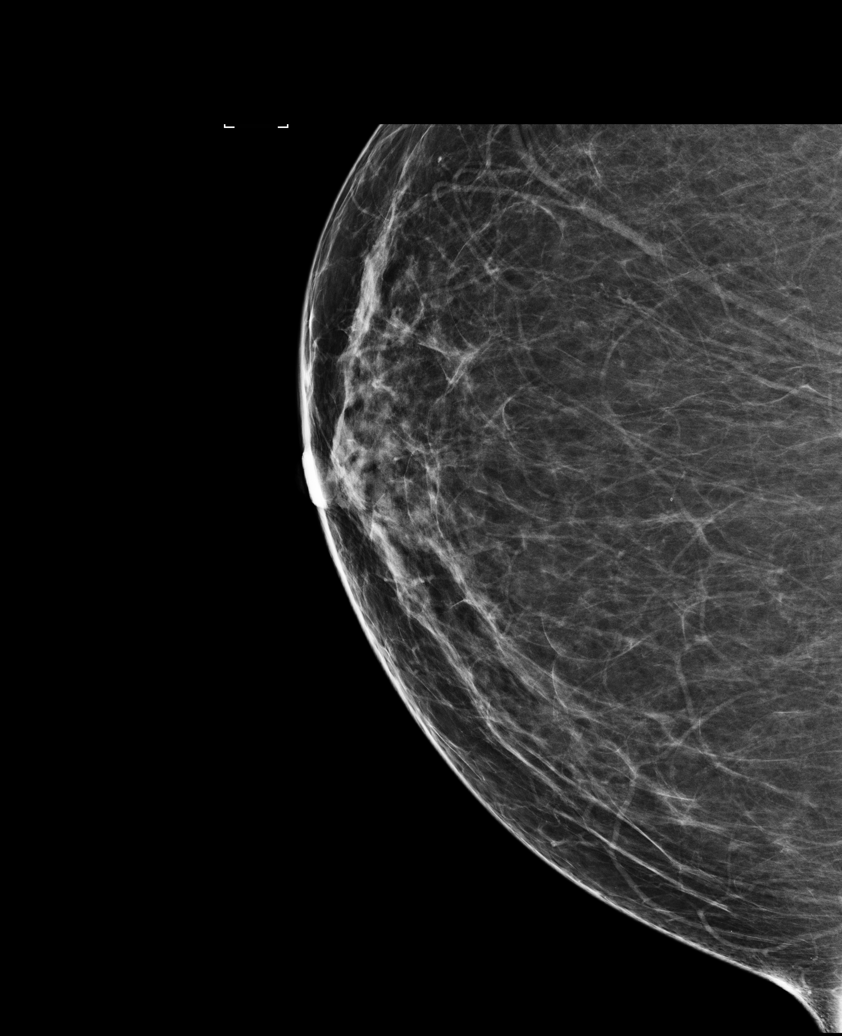

[6 of 6 positions shown; findings below may reference images not displayed]

ACR Breast Density Category b: There are scattered areas of
fibroglandular density.
FINDINGS: There are no findings suspicious for malignancy. Images were
processed with CAD.
IMPRESSION: No mammographic evidence of malignancy. A result letter of this
screening mammogram will be mailed directly to the patient.

RECOMMENDATION:
Screening mammogram in one year. (Code:[US])

BI-RADS CATEGORY  1: Negative.

## 2014-07-27 DIAGNOSIS — I10 Essential (primary) hypertension: Secondary | ICD-10-CM | POA: Insufficient documentation

## 2014-10-28 IMAGING — US US ABDOMEN LIMITED
1 series · 14 of 25 positions shown · non-contrast
Comparison: Abdominal ultrasound [DATE]

CLINICAL DATA: Elevated liver function studies

EXAM:
US ABDOMEN LIMITED - RIGHT UPPER QUADRANT

[Series 1: us abdomen limited · 0.20mm/px · 14 of 48 slices shown]
[im 1/48]
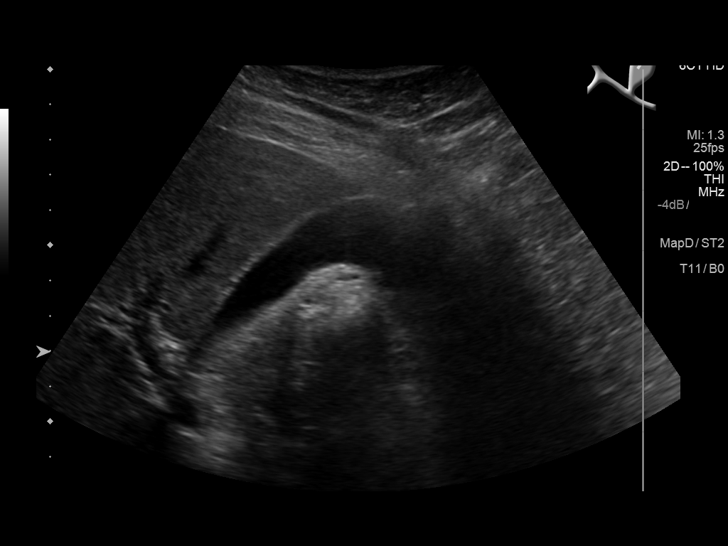
[im 4/48]
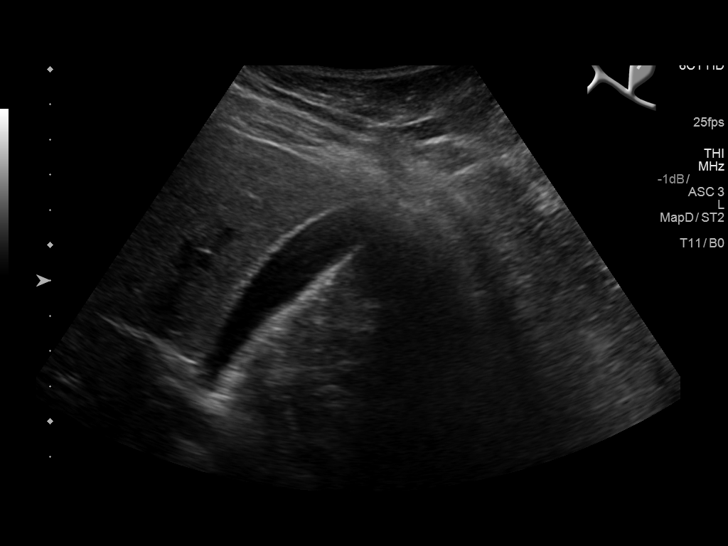
[im 8/48]
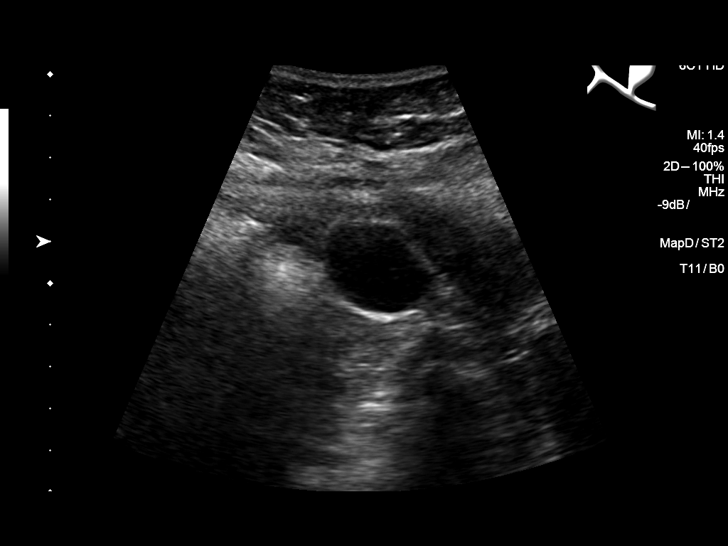
[im 12/48]
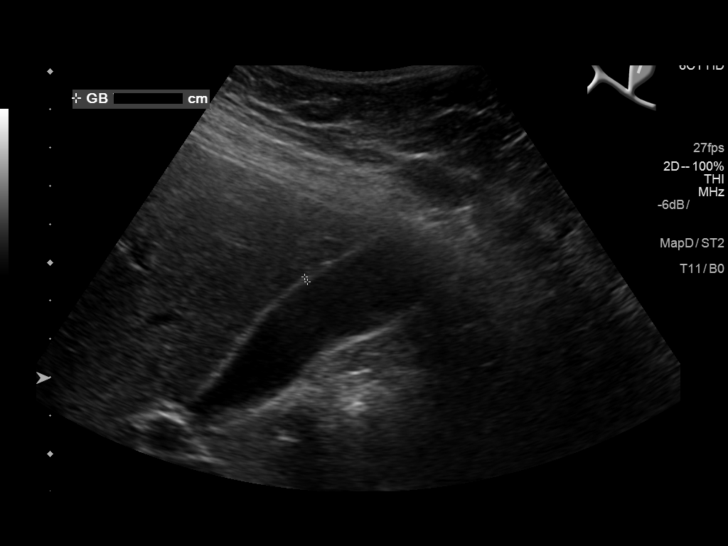
[im 16/48]
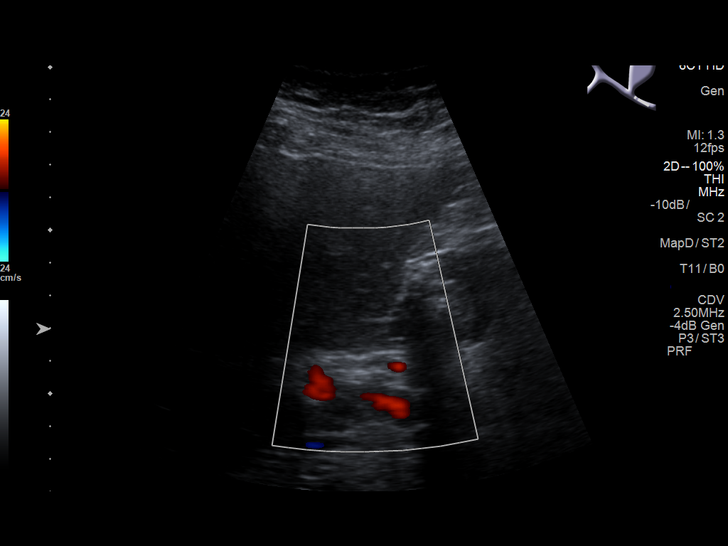
[im 18/48]
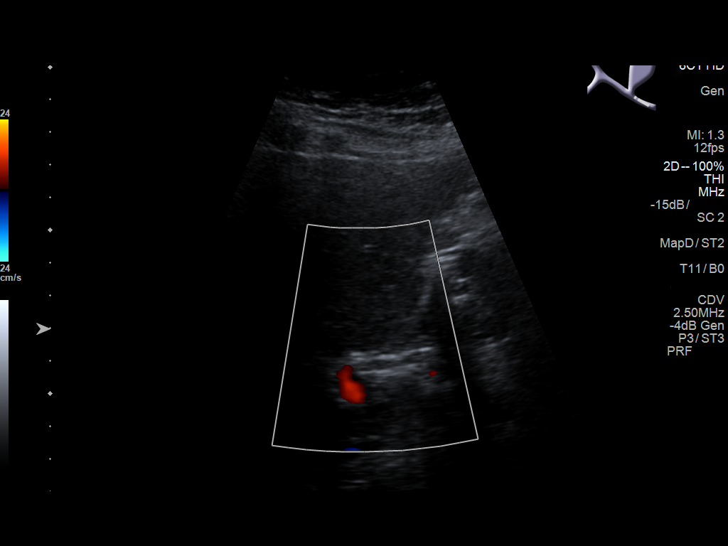
[im 22/48]
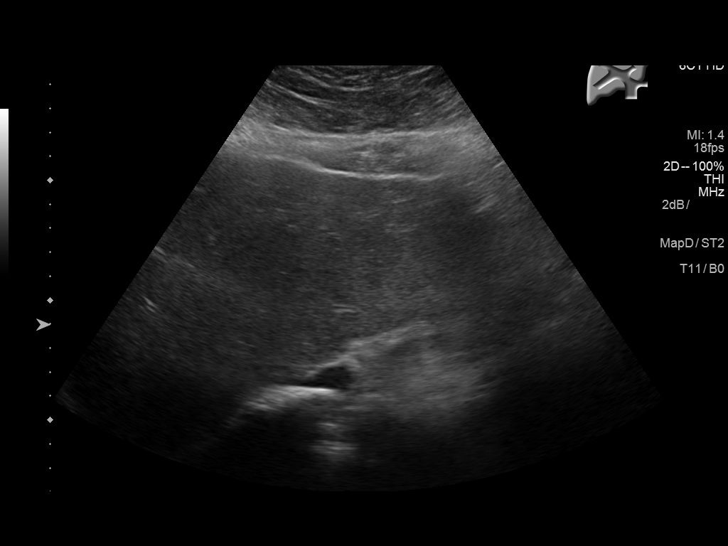
[im 26/48]
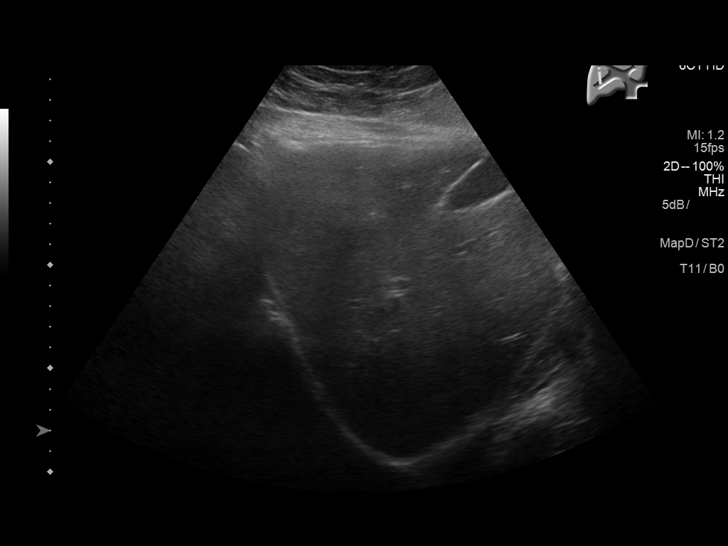
[im 30/48]
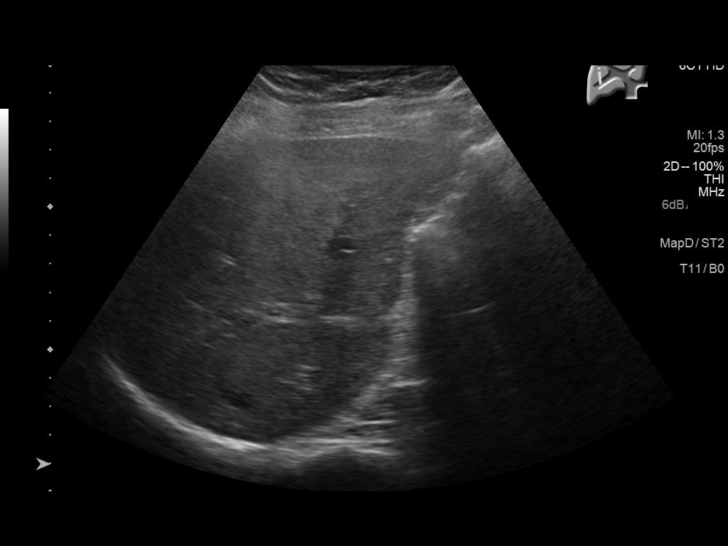
[im 32/48]
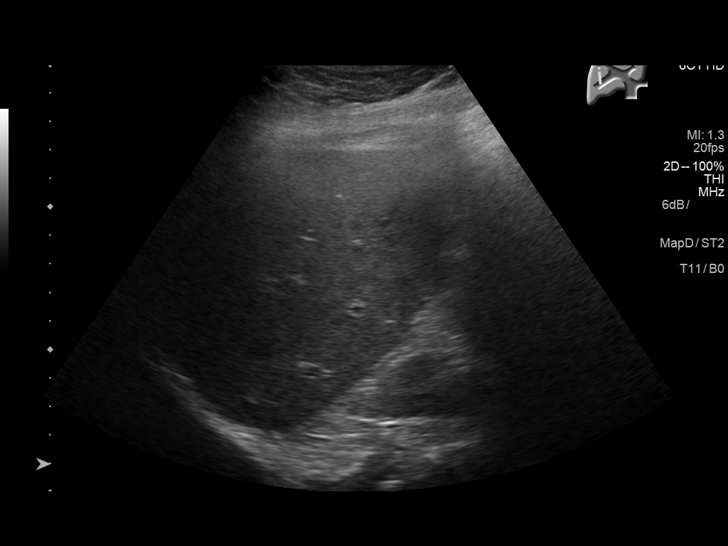
[im 36/48]
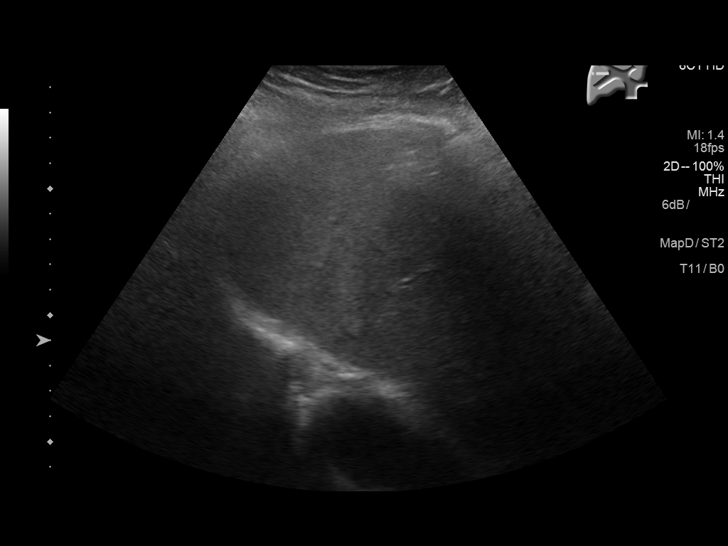
[im 40/48]
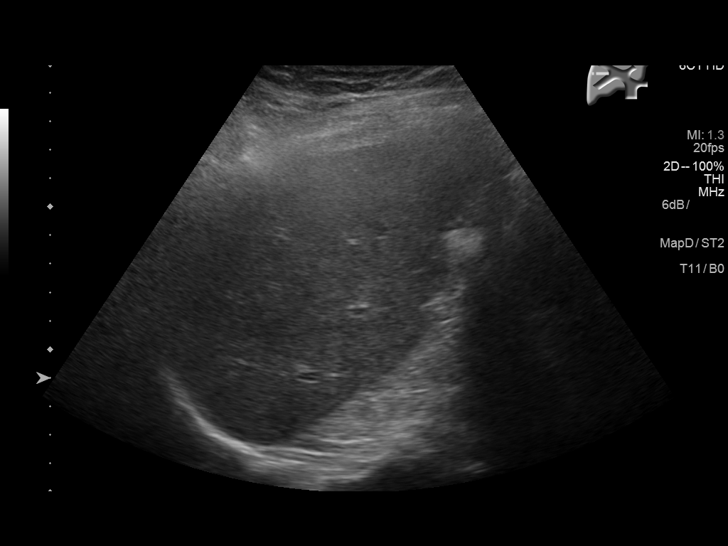
[im 44/48]
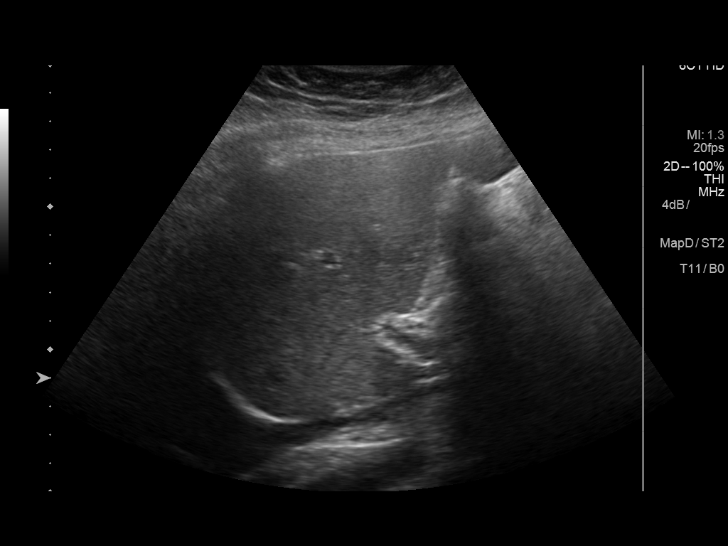
[im 48/48]
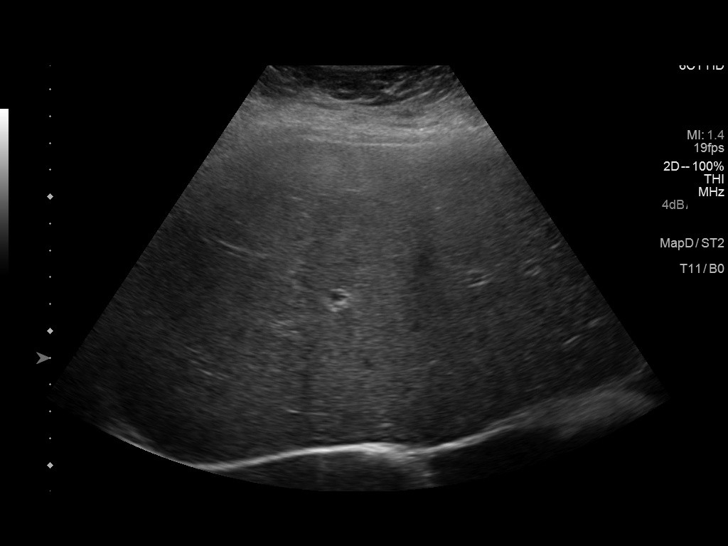

[14 of 25 positions shown; findings below may reference images not displayed]

FINDINGS: Gallbladder:

No gallstones or wall thickening visualized. No sonographic Murphy
sign noted by sonographer.

Common bile duct:

Diameter: 4.1 mm

Liver:

The liver exhibits normal echotexture. PE there is no focal mass or
ductal dilation. The surface contour of the liver is normal.
IMPRESSION: There is no acute or chronic abnormality demonstrated of the liver.
The gallbladder and common bile duct are normal as well.

## 2015-03-23 DIAGNOSIS — F329 Major depressive disorder, single episode, unspecified: Secondary | ICD-10-CM | POA: Insufficient documentation

## 2015-03-23 DIAGNOSIS — F32A Depression, unspecified: Secondary | ICD-10-CM | POA: Insufficient documentation

## 2015-04-19 ENCOUNTER — Other Ambulatory Visit: Payer: Self-pay | Admitting: Physician Assistant

## 2015-04-19 DIAGNOSIS — R945 Abnormal results of liver function studies: Secondary | ICD-10-CM

## 2015-04-24 ENCOUNTER — Ambulatory Visit: Payer: 59

## 2015-05-11 ENCOUNTER — Ambulatory Visit
Admission: RE | Admit: 2015-05-11 | Discharge: 2015-05-11 | Disposition: A | Discharge status: Home / Self Care | Payer: 59 | Source: Ambulatory Visit | Attending: Physician Assistant | Admitting: Physician Assistant

## 2015-05-11 DIAGNOSIS — R945 Abnormal results of liver function studies: Secondary | ICD-10-CM

## 2015-06-04 ENCOUNTER — Ambulatory Visit
Admission: RE | Admit: 2015-06-04 | Discharge: 2015-06-04 | Disposition: A | Discharge status: Home / Self Care | Payer: 59 | Source: Ambulatory Visit | Attending: Unknown Physician Specialty | Admitting: Unknown Physician Specialty

## 2015-06-04 ENCOUNTER — Ambulatory Visit: Payer: 59 | Admitting: Anesthesiology

## 2015-06-04 ENCOUNTER — Encounter
Admission: RE | Disposition: A | Discharge status: Home / Self Care | Payer: Self-pay | Source: Ambulatory Visit | Attending: Unknown Physician Specialty

## 2015-06-04 ENCOUNTER — Encounter: Payer: Self-pay | Admitting: *Deleted

## 2015-06-04 DIAGNOSIS — I10 Essential (primary) hypertension: Secondary | ICD-10-CM | POA: Diagnosis not present

## 2015-06-04 DIAGNOSIS — M1990 Unspecified osteoarthritis, unspecified site: Secondary | ICD-10-CM | POA: Insufficient documentation

## 2015-06-04 DIAGNOSIS — K640 First degree hemorrhoids: Secondary | ICD-10-CM | POA: Insufficient documentation

## 2015-06-04 DIAGNOSIS — F17210 Nicotine dependence, cigarettes, uncomplicated: Secondary | ICD-10-CM | POA: Insufficient documentation

## 2015-06-04 DIAGNOSIS — K449 Diaphragmatic hernia without obstruction or gangrene: Secondary | ICD-10-CM | POA: Insufficient documentation

## 2015-06-04 DIAGNOSIS — K2970 Gastritis, unspecified, without bleeding: Secondary | ICD-10-CM | POA: Diagnosis not present

## 2015-06-04 DIAGNOSIS — Z1211 Encounter for screening for malignant neoplasm of colon: Secondary | ICD-10-CM | POA: Insufficient documentation

## 2015-06-04 DIAGNOSIS — Z791 Long term (current) use of non-steroidal anti-inflammatories (NSAID): Secondary | ICD-10-CM | POA: Diagnosis not present

## 2015-06-04 DIAGNOSIS — F329 Major depressive disorder, single episode, unspecified: Secondary | ICD-10-CM | POA: Insufficient documentation

## 2015-06-04 DIAGNOSIS — R1013 Epigastric pain: Secondary | ICD-10-CM | POA: Diagnosis present

## 2015-06-04 DIAGNOSIS — Z79899 Other long term (current) drug therapy: Secondary | ICD-10-CM | POA: Diagnosis not present

## 2015-06-04 DIAGNOSIS — Z86010 Personal history of colonic polyps: Secondary | ICD-10-CM | POA: Insufficient documentation

## 2015-06-04 DIAGNOSIS — E785 Hyperlipidemia, unspecified: Secondary | ICD-10-CM | POA: Diagnosis not present

## 2015-06-04 DIAGNOSIS — D125 Benign neoplasm of sigmoid colon: Secondary | ICD-10-CM | POA: Insufficient documentation

## 2015-06-04 DIAGNOSIS — Z7982 Long term (current) use of aspirin: Secondary | ICD-10-CM | POA: Diagnosis not present

## 2015-06-04 DIAGNOSIS — K219 Gastro-esophageal reflux disease without esophagitis: Secondary | ICD-10-CM | POA: Insufficient documentation

## 2015-06-04 HISTORY — DX: Gastro-esophageal reflux disease without esophagitis: K21.9

## 2015-06-04 HISTORY — PX: COLONOSCOPY WITH PROPOFOL: SHX5780

## 2015-06-04 HISTORY — DX: Major depressive disorder, single episode, unspecified: F32.9

## 2015-06-04 HISTORY — PX: ESOPHAGOGASTRODUODENOSCOPY (EGD) WITH PROPOFOL: SHX5813

## 2015-06-04 HISTORY — DX: Headache, unspecified: R51.9

## 2015-06-04 HISTORY — DX: Essential (primary) hypertension: I10

## 2015-06-04 HISTORY — DX: Unspecified osteoarthritis, unspecified site: M19.90

## 2015-06-04 HISTORY — DX: Headache: R51

## 2015-06-04 HISTORY — DX: Hyperlipidemia, unspecified: E78.5

## 2015-06-04 HISTORY — DX: Depression, unspecified: F32.A

## 2015-06-04 SURGERY — COLONOSCOPY WITH PROPOFOL
Anesthesia: General

## 2015-06-04 MED ORDER — SODIUM CHLORIDE 0.9 % IV SOLN
INTRAVENOUS | Status: DC
  Administered 2015-06-04: 1000 mL via INTRAVENOUS

## 2015-06-04 MED ORDER — IPRATROPIUM-ALBUTEROL 0.5-2.5 (3) MG/3ML IN SOLN
RESPIRATORY_TRACT | Status: AC
  Filled 2015-06-04: qty 3

## 2015-06-04 MED ORDER — IPRATROPIUM-ALBUTEROL 0.5-2.5 (3) MG/3ML IN SOLN
3.0000 mL | Freq: Once | RESPIRATORY_TRACT | Status: AC
  Administered 2015-06-04: 3 mL via RESPIRATORY_TRACT

## 2015-06-04 MED ORDER — BUTAMBEN-TETRACAINE-BENZOCAINE 2-2-14 % EX AERO
INHALATION_SPRAY | CUTANEOUS | Status: DC | PRN
  Administered 2015-06-04: 1 via TOPICAL

## 2015-06-04 MED ORDER — MIDAZOLAM HCL 5 MG/5ML IJ SOLN
INTRAMUSCULAR | Status: DC | PRN
  Administered 2015-06-04 (×2): 1 mg via INTRAVENOUS

## 2015-06-04 MED ORDER — PROPOFOL 500 MG/50ML IV EMUL
INTRAVENOUS | Status: DC | PRN
  Administered 2015-06-04: 100 ug/kg/min via INTRAVENOUS

## 2015-06-04 MED ORDER — BUTAMBEN-TETRACAINE-BENZOCAINE 2-2-14 % EX AERO
INHALATION_SPRAY | CUTANEOUS | Status: AC
  Filled 2015-06-04: qty 20

## 2015-06-04 MED ORDER — ESMOLOL HCL 100 MG/10ML IV SOLN
INTRAVENOUS | Status: DC | PRN
  Administered 2015-06-04 (×2): 20 mg via INTRAVENOUS

## 2015-06-04 MED ORDER — LIDOCAINE HCL (PF) 2 % IJ SOLN
INTRAMUSCULAR | Status: DC | PRN
  Administered 2015-06-04: 60 mg

## 2015-06-04 MED ORDER — PROPOFOL 10 MG/ML IV BOLUS
INTRAVENOUS | Status: DC | PRN
  Administered 2015-06-04: 50 mg via INTRAVENOUS

## 2015-06-04 MED ORDER — FENTANYL CITRATE (PF) 100 MCG/2ML IJ SOLN
INTRAMUSCULAR | Status: DC | PRN
  Administered 2015-06-04: 50 ug via INTRAVENOUS

## 2015-06-04 MED ORDER — GLYCOPYRROLATE 0.2 MG/ML IJ SOLN
INTRAMUSCULAR | Status: DC | PRN
  Administered 2015-06-04: 0.2 mg via INTRAVENOUS

## 2015-06-04 NOTE — Transfer of Care (Signed)
Immediate Anesthesia Transfer of Care Note  Patient: Kimberly Lynn  Procedure(s) Performed: Procedure(s): COLONOSCOPY WITH PROPOFOL (N/A) ESOPHAGOGASTRODUODENOSCOPY (EGD) WITH PROPOFOL (N/A)  Patient Location: PACU  Anesthesia Type:General  Level of Consciousness: sedated  Airway & Oxygen Therapy: Patient Spontanous Breathing and Patient connected to nasal cannula oxygen  Post-op Assessment: Report given to RN and Post -op Vital signs reviewed and stable  Post vital signs: Reviewed and stable  Last Vitals:  Filed Vitals:   06/04/15 1040  BP: 140/89  Pulse: 87  Temp: 36.1 C  Resp: 18    Last Pain: There were no vitals filed for this visit.       Complications: No apparent anesthesia complications

## 2015-06-04 NOTE — Anesthesia Preprocedure Evaluation (Signed)
Anesthesia Evaluation  Patient identified by MRN, date of birth, ID band Patient awake    Reviewed: Allergy & Precautions, H&P , NPO status , Patient's Chart, lab work & pertinent test results  History of Anesthesia Complications Negative for: history of anesthetic complications  Airway Mallampati: III  TM Distance: >3 FB Neck ROM: full    Dental  (+) Poor Dentition, Chipped   Pulmonary neg shortness of breath, Current Smoker,    Pulmonary exam normal breath sounds clear to auscultation       Cardiovascular Exercise Tolerance: Good hypertension, (-) angina(-) Past MI and (-) DOE Normal cardiovascular exam Rhythm:regular Rate:Normal     Neuro/Psych  Headaches, PSYCHIATRIC DISORDERS    GI/Hepatic Neg liver ROS, GERD  Medicated and Controlled,  Endo/Other  Morbid obesity  Renal/GU negative Renal ROS  negative genitourinary   Musculoskeletal  (+) Arthritis ,   Abdominal   Peds  Hematology negative hematology ROS (+)   Anesthesia Other Findings Past Medical History:   Hypertension                                                 Hyperlipemia                                                 Depression                                                   GERD (gastroesophageal reflux disease)                       Headache                                                     Arthritis                                                   Past Surgical History:   ABDOMINAL HYSTERECTOMY                                        CESAREAN SECTION                                             BMI    Body Mass Index   45.71 kg/m 2      Reproductive/Obstetrics negative OB ROS                             Anesthesia Physical Anesthesia Plan  ASA: III  Anesthesia Plan: General   Post-op Pain Management:    Induction:   Airway Management Planned:   Additional Equipment:   Intra-op Plan:    Post-operative Plan:   Informed Consent: I have reviewed the patients History and Physical, chart, labs and discussed the procedure including the risks, benefits and alternatives for the proposed anesthesia with the patient or authorized representative who has indicated his/her understanding and acceptance.   Dental Advisory Given  Plan Discussed with: Anesthesiologist, CRNA and Surgeon  Anesthesia Plan Comments:         Anesthesia Quick Evaluation

## 2015-06-04 NOTE — H&P (Signed)
   Primary Care Physician:  WHITE, Orlene Och, NP Primary Gastroenterologist:  Dr. Vira Agar  Pre-Procedure History & Physical: HPI:  Kimberly Lynn is a 50 y.o. female is here for an endoscopy and colonoscopy.   Past Medical History  Diagnosis Date  . Hypertension   . Hyperlipemia   . Depression   . GERD (gastroesophageal reflux disease)   . Headache   . Arthritis     Past Surgical History  Procedure Laterality Date  . Abdominal hysterectomy    . Cesarean section      Prior to Admission medications   Medication Sig Start Date End Date Taking? Authorizing Provider  Aspirin-Salicylamide-Caffeine (BC HEADACHE POWDER PO) Take 1 packet by mouth as needed.   Yes Historical Provider, MD  atenolol (TENORMIN) 50 MG tablet Take 50 mg by mouth daily.   Yes Historical Provider, MD  atorvastatin (LIPITOR) 10 MG tablet Take 10 mg by mouth daily.   Yes Historical Provider, MD  buPROPion (WELLBUTRIN XL) 300 MG 24 hr tablet Take 300 mg by mouth daily.   Yes Historical Provider, MD  hydrochlorothiazide (HYDRODIURIL) 12.5 MG tablet Take 12.5 mg by mouth daily.   Yes Historical Provider, MD  ibuprofen (ADVIL,MOTRIN) 200 MG tablet Take 200 mg by mouth every 6 (six) hours as needed.   Yes Historical Provider, MD  pantoprazole (PROTONIX) 40 MG tablet Take 40 mg by mouth 2 (two) times daily.   Yes Historical Provider, MD    Allergies as of 05/21/2015  . (Not on File)    History reviewed. No pertinent family history.  Social History   Social History  . Marital Status: Married    Spouse Name: N/A  . Number of Children: N/A  . Years of Education: N/A   Occupational History  . Not on file.   Social History Main Topics  . Smoking status: Current Every Day Smoker    Types: Cigarettes  . Smokeless tobacco: Not on file  . Alcohol Use: No  . Drug Use: No  . Sexual Activity: Not on file   Other Topics Concern  . Not on file   Social History Narrative    Review of Systems: See HPI,  otherwise negative ROS  Physical Exam: BP 140/89 mmHg  Pulse 87  Temp(Src) 96.9 F (36.1 C) (Tympanic)  Resp 18  Ht 5\' 2"  (1.575 m)  Wt 113.399 kg (250 lb)  BMI 45.71 kg/m2  SpO2 99% General:   Alert,  pleasant and cooperative in NAD Head:  Normocephalic and atraumatic. Neck:  Supple; no masses or thyromegaly. Lungs:  Clear throughout to auscultation.    Heart:  Regular rate and rhythm. Abdomen:  Soft, nontender and nondistended. Normal bowel sounds, without guarding, and without rebound.   Neurologic:  Alert and  oriented x4;  grossly normal neurologically.  Impression/Plan: Kimberly Lynn is here for an endoscopy and colonoscopy to be performed for [facility] colon polyps, epigastric abd pain  Risks, benefits, limitations, and alternatives regarding  endoscopy and colonoscopy have been reviewed with the patient.  Questions have been answered.  All parties agreeable.   Gaylyn Cheers, MD  06/04/2015, 11:27 AM

## 2015-06-04 NOTE — Op Note (Signed)
[facility] Patient Name: Kimberly Lynn Procedure Date: 06/04/2015 11:29 AM MRN: [id number] Account #: [id number] Date of Birth: [birth year] Admit Type: Outpatient Age: 50 Room: [facility] ENDO ROOM 1 Gender: Female Note Status: Finalized Procedure:            Colonoscopy Indications:          High risk colon cancer surveillance: Personal history                        of colonic polyps Providers:            Manya Silvas, MD Referring MD:         Dani Gobble. White, MD (Referring MD) Medicines:            Propofol per Anesthesia Complications:        No immediate complications. Procedure:            Pre-Anesthesia Assessment:                       - After reviewing the risks and benefits, the patient                        was deemed in satisfactory condition to undergo the                        procedure.                       After obtaining informed consent, the colonoscope was                        passed under direct vision. Throughout the procedure,                        the patient's blood pressure, pulse, and oxygen                        saturations were monitored continuously. The                        Colonoscope was introduced through the anus and                        advanced to the the cecum, identified by appendiceal                        orifice and ileocecal valve. The colonoscopy was                        performed without difficulty. The patient tolerated the                        procedure well. The quality of the bowel preparation                        was excellent. Findings:      A diminutive polyp was found in the sigmoid colon. The polyp was       sessile. The polyp was removed with a jumbo cold forceps. Resection and       retrieval were complete.      Internal hemorrhoids were found  during endoscopy. The hemorrhoids were       small and Grade I (internal hemorrhoids that do not prolapse).      The exam was  otherwise without abnormality. Impression:           - One diminutive polyp in the sigmoid colon, removed                        with a jumbo cold forceps. Resected and retrieved.                       - Internal hemorrhoids.                       - The examination was otherwise normal. Recommendation:       - Await pathology results. Manya Silvas, MD 06/04/2015 12:06:41 PM This report has been signed electronically. Number of Addenda: 0 Note Initiated On: 06/04/2015 11:29 AM Scope Withdrawal Time: 0 hours 12 minutes 11 seconds  Total Procedure Duration: 0 hours 14 minutes 4 seconds       [facility]

## 2015-06-04 NOTE — Op Note (Signed)
[facility] Gastroenterology Patient Name: Kimberly Lynn Procedure Date: 06/04/2015 11:31 AM MRN: [id number] Account #: [id number] Date of Birth: 20-Dec-1965 Admit Type: Outpatient Age: 50 Room: [facility] ENDO ROOM 1 Gender: Female Note Status: Finalized Procedure:            Upper GI endoscopy Indications:          Epigastric abdominal pain Providers:            Manya Silvas, MD Referring MD:         Dani Gobble. White, MD (Referring MD) Medicines:            Propofol per Anesthesia Complications:        No immediate complications. Procedure:            Pre-Anesthesia Assessment:                       - After reviewing the risks and benefits, the patient                        was deemed in satisfactory condition to undergo the                        procedure.                       After obtaining informed consent, the endoscope was                        passed under direct vision. Throughout the procedure,                        the patient's blood pressure, pulse, and oxygen                        saturations were monitored continuously. The Endoscope                        was introduced through the mouth, and advanced to the                        second part of duodenum. The upper GI endoscopy was                        accomplished without difficulty. The patient tolerated                        the procedure well. Findings:      The examined esophagus was normal. GEJ 38-39cm.      A small hiatal hernia was present.      Localized mild inflammation characterized by erythema and granularity       was found in the gastric antrum. Biopsies were taken with a cold forceps       for histology. Biopsies were taken with a cold forceps for Helicobacter       pylori testing.      The examined duodenum was normal. Impression:           - Normal esophagus.                       - Small hiatal hernia.                       -  Gastritis. Biopsied.   - Normal examined duodenum. Recommendation:       - Await pathology results. Stop asprin and aleve.                       - Perform a colonoscopy as previously scheduled. Manya Silvas, MD 06/04/2015 11:49:15 AM This report has been signed electronically. Number of Addenda: 0 Note Initiated On: 06/04/2015 11:31 AM      [facility]

## 2015-06-04 NOTE — Anesthesia Postprocedure Evaluation (Signed)
Anesthesia Post Note  Patient: Aileena Alcivar  Procedure(s) Performed: Procedure(s) (LRB): COLONOSCOPY WITH PROPOFOL (N/A) ESOPHAGOGASTRODUODENOSCOPY (EGD) WITH PROPOFOL (N/A)  Patient location during evaluation: Endoscopy Anesthesia Type: General Level of consciousness: awake and alert Pain management: pain level controlled Vital Signs Assessment: post-procedure vital signs reviewed and stable Respiratory status: spontaneous breathing, nonlabored ventilation, respiratory function stable and patient connected to nasal cannula oxygen Cardiovascular status: blood pressure returned to baseline and stable Postop Assessment: no signs of nausea or vomiting Anesthetic complications: no    Last Vitals:  Filed Vitals:   06/04/15 1230 06/04/15 1240  BP: 127/85 124/89  Pulse: 79 64  Temp:    Resp: 18 30    Last Pain: There were no vitals filed for this visit.               Precious Haws Arretta Toenjes

## 2015-06-05 ENCOUNTER — Encounter: Payer: Self-pay | Admitting: Unknown Physician Specialty

## 2015-06-06 LAB — SURGICAL PATHOLOGY

## 2015-09-25 DIAGNOSIS — Z6841 Body Mass Index (BMI) 40.0 and over, adult: Secondary | ICD-10-CM | POA: Insufficient documentation

## 2016-01-28 DIAGNOSIS — K760 Fatty (change of) liver, not elsewhere classified: Secondary | ICD-10-CM | POA: Insufficient documentation

## 2016-08-06 ENCOUNTER — Encounter: Payer: Self-pay | Admitting: Emergency Medicine

## 2016-08-06 ENCOUNTER — Emergency Department: Payer: BLUE CROSS/BLUE SHIELD

## 2016-08-06 ENCOUNTER — Emergency Department
Admission: EM | Admit: 2016-08-06 | Discharge: 2016-08-06 | Disposition: A | Discharge status: Home / Self Care | Payer: BLUE CROSS/BLUE SHIELD | Attending: Emergency Medicine | Admitting: Emergency Medicine

## 2016-08-06 DIAGNOSIS — F17210 Nicotine dependence, cigarettes, uncomplicated: Secondary | ICD-10-CM | POA: Insufficient documentation

## 2016-08-06 DIAGNOSIS — I10 Essential (primary) hypertension: Secondary | ICD-10-CM | POA: Diagnosis not present

## 2016-08-06 DIAGNOSIS — R079 Chest pain, unspecified: Secondary | ICD-10-CM | POA: Diagnosis present

## 2016-08-06 DIAGNOSIS — Z79899 Other long term (current) drug therapy: Secondary | ICD-10-CM | POA: Diagnosis not present

## 2016-08-06 LAB — CBC
HCT: 40.1 % (ref 35.0–47.0)
Hemoglobin: 13.7 g/dL (ref 12.0–16.0)
MCH: 30.1 pg (ref 26.0–34.0)
MCHC: 34.1 g/dL (ref 32.0–36.0)
MCV: 88.2 fL (ref 80.0–100.0)
Platelets: 214 K/uL (ref 150–440)
RBC: 4.55 MIL/uL (ref 3.80–5.20)
RDW: 14.3 % (ref 11.5–14.5)
WBC: 5.9 K/uL (ref 3.6–11.0)

## 2016-08-06 LAB — COMPREHENSIVE METABOLIC PANEL WITH GFR
ALT: 76 U/L — ABNORMAL HIGH (ref 14–54)
AST: 78 U/L — ABNORMAL HIGH (ref 15–41)
Albumin: 3.7 g/dL (ref 3.5–5.0)
Alkaline Phosphatase: 175 U/L — ABNORMAL HIGH (ref 38–126)
Anion gap: 7 (ref 5–15)
BUN: 14 mg/dL (ref 6–20)
CO2: 27 mmol/L (ref 22–32)
Calcium: 9.4 mg/dL (ref 8.9–10.3)
Chloride: 106 mmol/L (ref 101–111)
Creatinine, Ser: 0.87 mg/dL (ref 0.44–1.00)
GFR calc Af Amer: >60 mL/min
GFR calc non Af Amer: >60 mL/min
Glucose, Bld: 96 mg/dL (ref 65–99)
Potassium: 3.9 mmol/L (ref 3.5–5.1)
Sodium: 140 mmol/L (ref 135–145)
Total Bilirubin: 0.7 mg/dL (ref 0.3–1.2)
Total Protein: 7.5 g/dL (ref 6.5–8.1)

## 2016-08-06 LAB — TROPONIN I: Troponin I: <0.03 ng/mL (ref <0.03)

## 2016-08-06 LAB — LIPASE, BLOOD: Lipase: 23 U/L (ref 11–51)

## 2016-08-06 IMAGING — CR DG CHEST 2V
2 series · 2 of 2 positions shown · non-contrast
Comparison: None.

CLINICAL DATA: Chest pain for 3 days.

EXAM:
CHEST  2 VIEW

[chest pa]
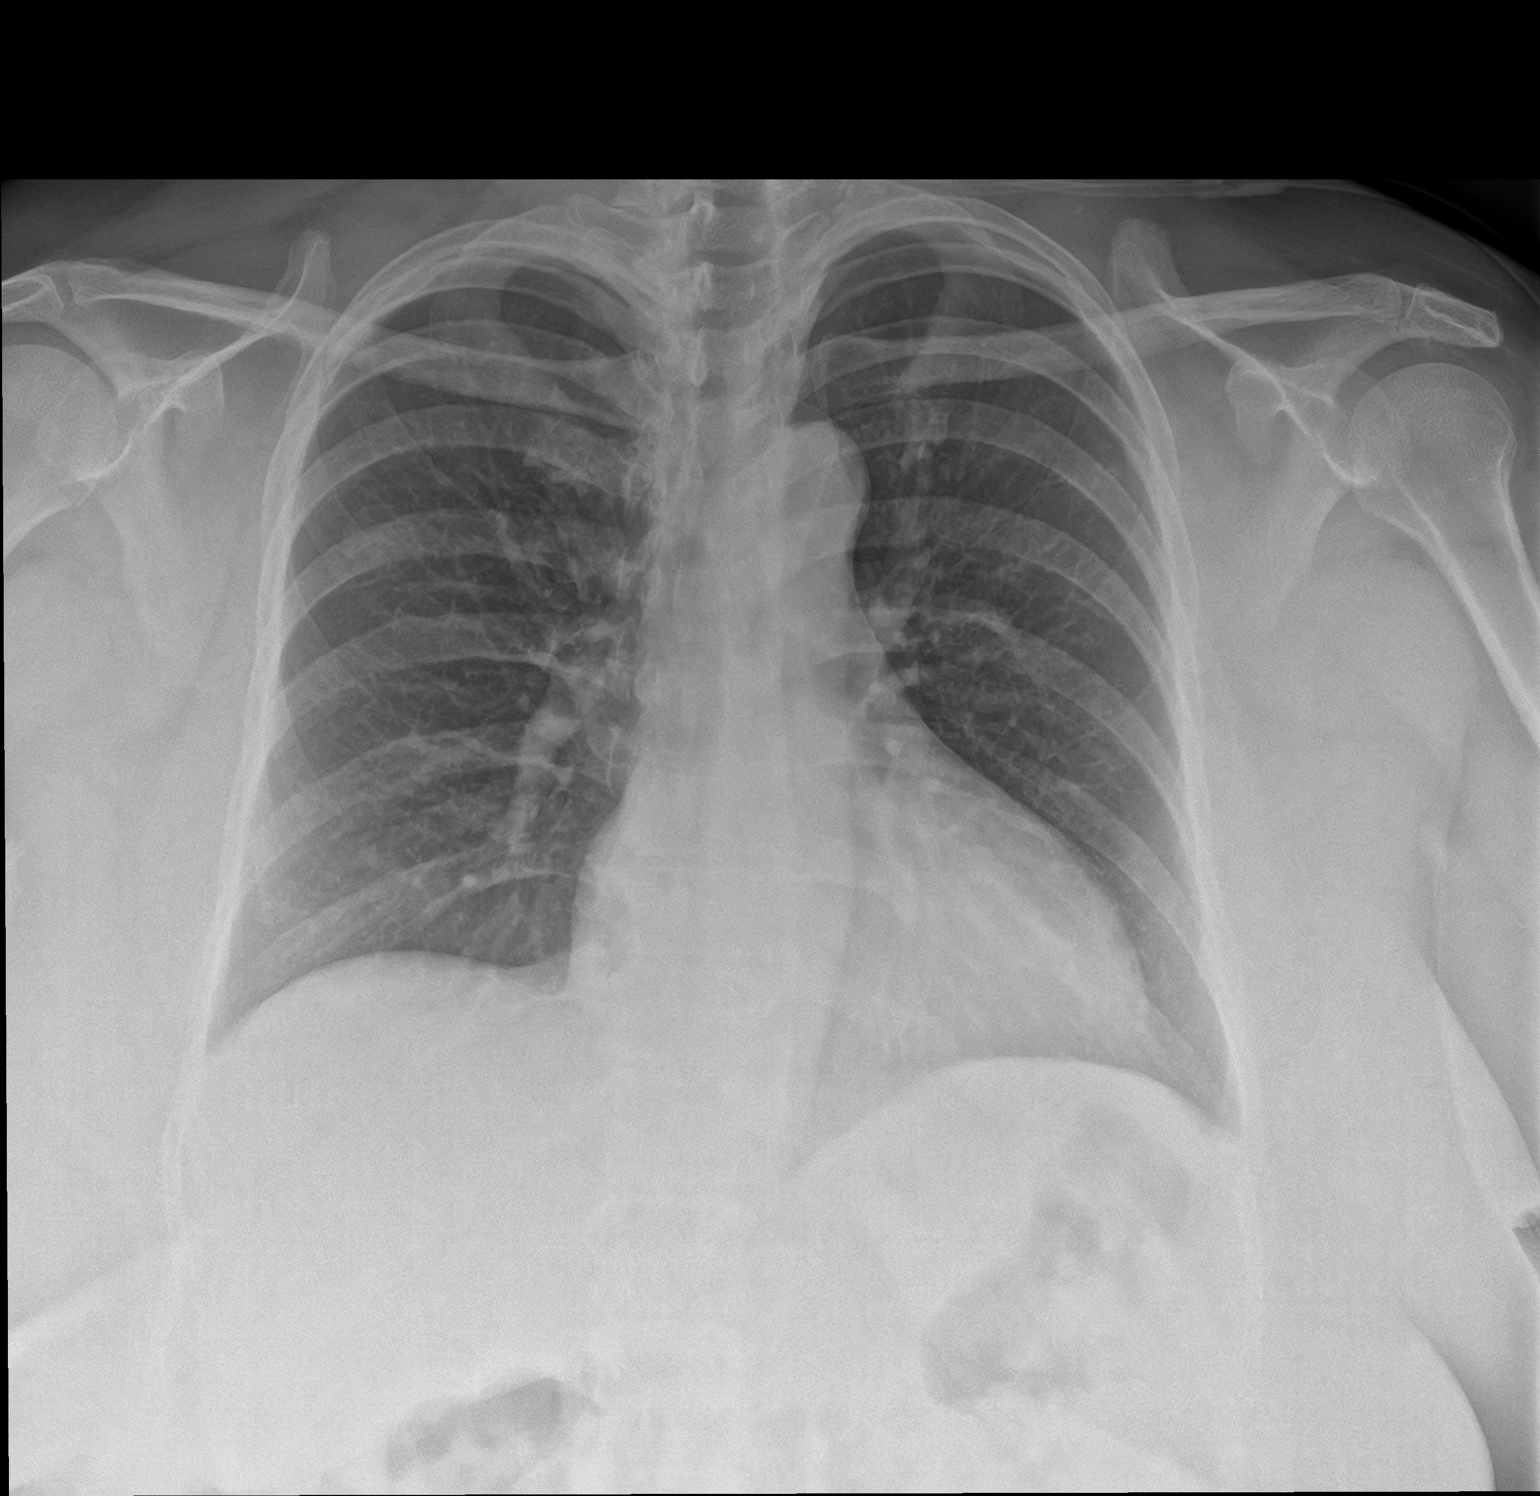

[chest lat]
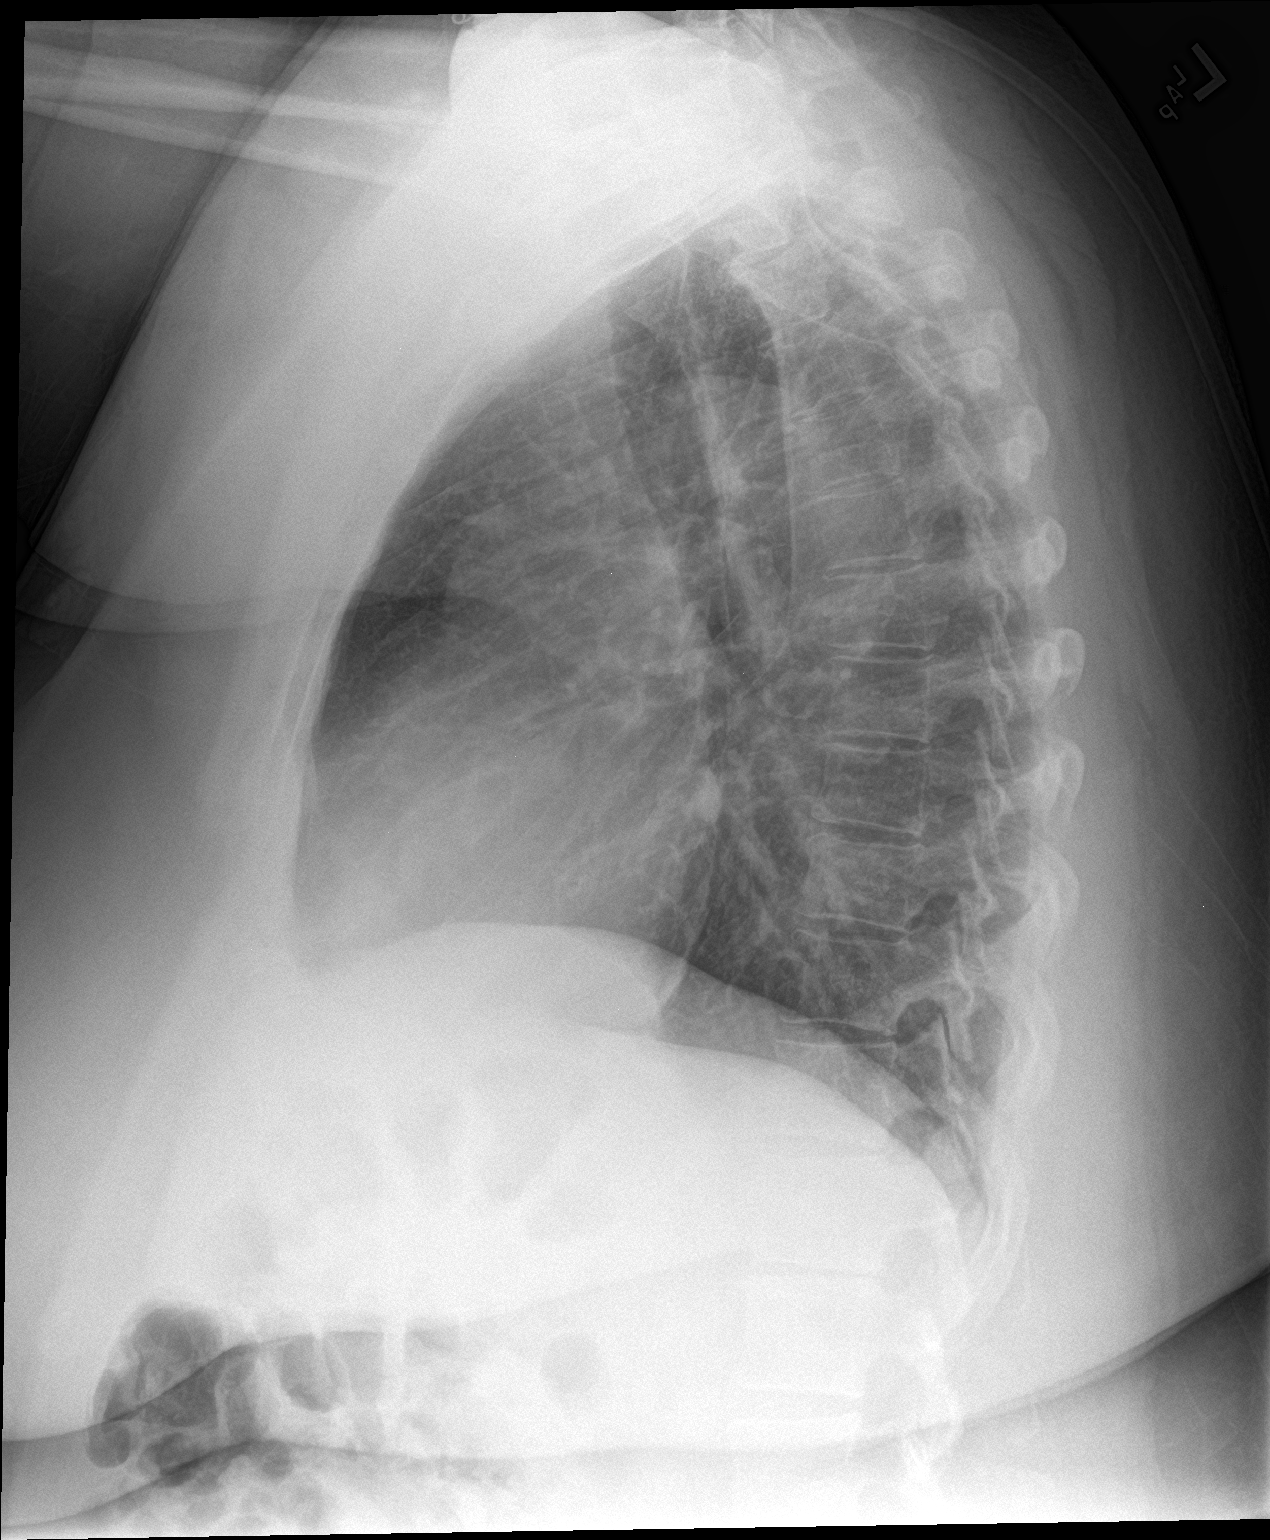

[2 of 2 positions shown; findings below may reference images not displayed]

FINDINGS: The cardiac silhouette, mediastinal an hilar contours are upper
limits of normal. There is mild tortuosity of thoracic aorta. The
lungs are clear of acute process. No infiltrates, edema or
effusions. Possible vague density in the right upper lobe may just
be summation shadows of the ribs and vessels but I would recommend a
repeat two view chest x-ray in 3 months to re-evaluate. The bony
thorax is intact.
IMPRESSION: 1. No acute cardiopulmonary findings.
2. **An incidental finding of potential clinical significance has
been found. Possible right upper lobe pulmonary nodule. Recommend
repeat two view chest x-ray in 3 months to reassess.**

## 2016-08-06 MED ORDER — GI COCKTAIL ~~LOC~~
30.0000 mL | Freq: Once | ORAL | Status: AC
  Administered 2016-08-06: 30 mL via ORAL
  Filled 2016-08-06: qty 30

## 2016-08-06 MED ORDER — PANTOPRAZOLE SODIUM 40 MG PO TBEC: 40.0000 mg | DELAYED_RELEASE_TABLET | Freq: Every day | ORAL | 1 refills | Status: AC

## 2016-08-06 NOTE — ED Notes (Signed)
AAOx3.  Skin warm and dry.  NAD 

## 2016-08-06 NOTE — Discharge Instructions (Signed)
You have been seen in the emergency department today for chest pain. Your workup has shown normal results, and your pain could very likely be due to gastric reflux. As we discussed please follow-up with your primary care physician in the next 1-2 days for recheck. Return to the emergency department for any further chest pain, trouble breathing, or any other symptom personally concerning to yourself.  As we discussed her chest x-ray did show what appears to be a cluster of lung nodules in the right upper lung field. Please obtain a chest x-ray in approximately 3 months through your primary care doctor to reassess to ensure that there is no increase in size.

## 2016-08-06 NOTE — ED Provider Notes (Signed)
[facility] Emergency Department Provider Note  Time seen: 9:42 AM  I have reviewed the triage vital signs and the nursing notes.   HISTORY  Chief Complaint Chest Pain    HPI Kimberly Lynn is a 51 y.o. female with a past medical history of gastric reflux, depression, hypertension, hyperlipidemia presents to the emergency department for chest discomfort. According to the patient she has a long history of gastric reflux, her primary care doctor took her off of her reflux medication 1 month ago. Patient states she had been doing well until approximately 3 or 4 days ago and she once again began feeling chest discomfort which she states felt like indigestion or reflux. The patient has taken Zantac without relief, she became concerned so she came to the emergency department. She does state intermittent nausea with the discomfort but denies diaphoresis or shortness of breath. Denies vomiting. Denies fever. States intermittent abdominal cramping which is fairly diffuse per patient denies any focal abdominal pain.  Past Medical History:  Diagnosis Date  . Arthritis   . Depression   . GERD (gastroesophageal reflux disease)   . Headache   . Hyperlipemia   . Hypertension     There are no active problems to display for this patient.   Past Surgical History:  Procedure Laterality Date  . ABDOMINAL HYSTERECTOMY    . CESAREAN SECTION    . COLONOSCOPY WITH PROPOFOL N/A 06/04/2015   Procedure: COLONOSCOPY WITH PROPOFOL;  Surgeon: Manya Silvas, MD;  Location: [facility] ENDOSCOPY;  Service: Endoscopy;  Laterality: N/A;  . ESOPHAGOGASTRODUODENOSCOPY (EGD) WITH PROPOFOL N/A 06/04/2015   Procedure: ESOPHAGOGASTRODUODENOSCOPY (EGD) WITH PROPOFOL;  Surgeon: Manya Silvas, MD;  Location: [facility] ENDOSCOPY;  Service: Endoscopy;  Laterality: N/A;    Prior to Admission medications   Medication Sig Start Date End Date Taking? Authorizing Provider  Aspirin-Salicylamide-Caffeine (BC  HEADACHE POWDER PO) Take 1 packet by mouth as needed.    [provider]  atenolol (TENORMIN) 50 MG tablet Take 50 mg by mouth daily.    [provider]  atorvastatin (LIPITOR) 10 MG tablet Take 10 mg by mouth daily.    [provider]  buPROPion (WELLBUTRIN XL) 300 MG 24 hr tablet Take 300 mg by mouth daily.    [provider]  hydrochlorothiazide (HYDRODIURIL) 12.5 MG tablet Take 12.5 mg by mouth daily.    [provider]  ibuprofen (ADVIL,MOTRIN) 200 MG tablet Take 200 mg by mouth every 6 (six) hours as needed.    [provider]  pantoprazole (PROTONIX) 40 MG tablet Take 40 mg by mouth 2 (two) times daily.    [provider]    No Known Allergies  No family history on file.  Social History Social History  Substance Use Topics  . Smoking status: Current Every Day Smoker    Types: Cigarettes  . Smokeless tobacco: Never Used  . Alcohol use No    Review of Systems Constitutional: Negative for fever. Cardiovascular: Mild central to right-sided chest discomfort Respiratory: Negative for shortness of breath. Gastrointestinal: Occasional abdominal cramping. Positive for nausea. Negative vomiting Musculoskeletal: Negative for leg pain or swelling Neurological: Negative for headache All other ROS negative  ____________________________________________   PHYSICAL EXAM:  VITAL SIGNS: ED Triage Vitals  Enc Vitals Group     BP --      Pulse Rate 08/06/16 0938 73     Resp 08/06/16 0938 16     Temp 08/06/16 0938 98.4 F (36.9 C)  Temp src --      SpO2 08/06/16 0938 100 %     Weight 08/06/16 0940 250 lb (113.4 kg)     Height 08/06/16 0940 5\' 2"  (1.575 m)     Head Circumference --      Peak Flow --      Pain Score --      Pain Loc --      Pain Edu? --      Excl. in [place]? --     Constitutional: Alert and oriented. Well appearing and in no distress. Eyes: Normal exam ENT   Head: Normocephalic and  atraumatic.   Mouth/Throat: Mucous membranes are moist. Cardiovascular: Normal rate, regular rhythm. No murmur Respiratory: Normal respiratory effort without tachypnea nor retractions. Breath sounds are clear  Gastrointestinal: Soft and nontender. No distention.  Musculoskeletal: Nontender with normal range of motion in all extremities.  Neurologic:  Normal speech and language. No gross focal neurologic deficits Skin:  Skin is warm, dry and intact.  Psychiatric: Mood and affect are normal.   ____________________________________________   RADIOLOGY  Chest x-ray negative for acute abnormality. Right upper lobe pulmonary nodule.  ____________________________________________   INITIAL IMPRESSION / ASSESSMENT AND PLAN / ED COURSE  Pertinent labs & imaging results that were available during my care of the patient were reviewed by me and considered in my medical decision making (see chart for details).  The patient presents to the emergency department for chest discomfort over the past several days. States it feels like reflux but has not improved with Zantac. Denies any abdominal pain but states occasionally she'll have abdominal cramping over the past few days with nausea. Denies any diaphoresis or shortness of breath. No leg pain or swelling. Overall the patient appears very well, no distress. We will check labs, EKG chest x-ray. We will also check LFTs and lipase and use a GI cocktail for symptom relief.  Patient is workup is negative including negative troponin. Chest x-ray does show a right upper lobe nodule, discusses the patient as well as 3 month follow-up. Patient will be discharged with PCP follow-up. ____________________________________________   FINAL CLINICAL IMPRESSION(S) / ED DIAGNOSES  Chest pain    Harvest Dark, MD 08/06/16 1058

## 2016-08-06 NOTE — ED Triage Notes (Signed)
C/O chest pressure and acid reflux x 3 days.  Seen by [facility] today and sent to ED for evaluation.  1 sl NTG given at [facility] urgent care.  324 ASA given PTA.  Patient describes chest tightness/ heaviness across chest and toward right back.

## 2016-08-26 DIAGNOSIS — R1900 Intra-abdominal and pelvic swelling, mass and lump, unspecified site: Secondary | ICD-10-CM | POA: Insufficient documentation

## 2016-09-15 DIAGNOSIS — R0609 Other forms of dyspnea: Secondary | ICD-10-CM | POA: Insufficient documentation

## 2016-10-16 DIAGNOSIS — D270 Benign neoplasm of right ovary: Secondary | ICD-10-CM | POA: Insufficient documentation

## 2017-03-13 DIAGNOSIS — K219 Gastro-esophageal reflux disease without esophagitis: Secondary | ICD-10-CM | POA: Insufficient documentation

## 2017-03-26 DIAGNOSIS — E785 Hyperlipidemia, unspecified: Secondary | ICD-10-CM | POA: Insufficient documentation

## 2017-03-27 ENCOUNTER — Ambulatory Visit (INDEPENDENT_AMBULATORY_CARE_PROVIDER_SITE_OTHER): Payer: BLUE CROSS/BLUE SHIELD

## 2017-03-27 ENCOUNTER — Encounter: Payer: Self-pay | Admitting: Podiatry

## 2017-03-27 ENCOUNTER — Ambulatory Visit: Payer: BLUE CROSS/BLUE SHIELD | Admitting: Podiatry

## 2017-03-27 DIAGNOSIS — M722 Plantar fascial fibromatosis: Secondary | ICD-10-CM

## 2017-03-30 NOTE — Progress Notes (Signed)
   Subjective: Patient presents today for sharp, stabbing pain and tenderness in the right heel that began 2-3 weeks ago. She reports associated numbness of the toes of the right foot. Patient states that it hurts in the mornings with the first steps out of bed. She has been taking Tylenol, BC Powder and applying heat therapy with no significant relief. Patient presents today for further treatment and evaluation.  Past Medical History:  Diagnosis Date  . Arthritis   . Depression   . GERD (gastroesophageal reflux disease)   . Headache   . Hyperlipemia   . Hypertension      Objective: Physical Exam General: The patient is alert and oriented x3 in no acute distress.  Dermatology: Skin is warm, dry and supple bilateral lower extremities. Negative for open lesions or macerations bilateral.   Vascular: Dorsalis Pedis and Posterior Tibial pulses palpable bilateral.  Capillary fill time is immediate to all digits.  Neurological: Epicritic and protective threshold intact bilateral.   Musculoskeletal: Tenderness to palpation to the plantar aspect of the right heel along the plantar fascia. All other joints range of motion within normal limits bilateral. Strength 5/5 in all groups bilateral.   Radiographic exam: Normal osseous mineralization. Joint spaces preserved. No fracture/dislocation/boney destruction. No other soft tissue abnormalities or radiopaque foreign bodies.   Assessment: 1. Plantar fasciitis right 2. Pain in right foot  Plan of Care:  1. Patient evaluated. Xrays reviewed.   2. Injection of 0.5cc Celestone soluspan injected into the right plantar fascia  3. Prescription for Mobic 15 mg provided to patient.  4. Night splints dispensed. 5. Instructed patient regarding therapies and modalities at home to alleviate symptoms.  6. Return to clinic in 4 weeks.     Edrick Kins, DPM [facility]  Dr. Edrick Kins, DPM    [address], [place] [address]                Office [phone]  Fax [phone]

## 2017-04-02 MED ORDER — MELOXICAM 15 MG PO TABS: 15.0000 mg | ORAL_TABLET | Freq: Every day | ORAL | 3 refills | Status: DC

## 2017-04-02 NOTE — Addendum Note (Signed)
Addended by: Graceann Congress D on: 04/02/2017 11:45 AM   Modules accepted: Orders

## 2017-04-24 ENCOUNTER — Encounter: Payer: BLUE CROSS/BLUE SHIELD | Admitting: Podiatry

## 2017-05-05 NOTE — Progress Notes (Signed)
This encounter was created in error - please disregard.

## 2017-06-20 ENCOUNTER — Other Ambulatory Visit: Payer: Self-pay

## 2017-06-20 ENCOUNTER — Ambulatory Visit
Admission: EM | Admit: 2017-06-20 | Discharge: 2017-06-20 | Disposition: A | Discharge status: Home / Self Care | Payer: BLUE CROSS/BLUE SHIELD | Attending: Family Medicine | Admitting: Family Medicine

## 2017-06-20 DIAGNOSIS — H66005 Acute suppurative otitis media without spontaneous rupture of ear drum, recurrent, left ear: Secondary | ICD-10-CM | POA: Diagnosis not present

## 2017-06-20 MED ORDER — AMOXICILLIN-POT CLAVULANATE 875-125 MG PO TABS: 1.0000 | ORAL_TABLET | Freq: Two times a day (BID) | ORAL | 0 refills | Status: AC

## 2017-06-20 MED ORDER — LORATADINE-PSEUDOEPHEDRINE ER 5-120 MG PO TB12: 1.0000 | ORAL_TABLET | Freq: Two times a day (BID) | ORAL | 0 refills | Status: DC

## 2017-06-20 NOTE — ED Triage Notes (Signed)
Pt with one month of left ear pain and feeling stopped up. [facility] and was given Prednisone but not better. Pain 8/10

## 2017-06-20 NOTE — Discharge Instructions (Addendum)
Please take medications as prescribed.  Follow-up PCP if no improvement in 5 to 7 days.  Return to the urgent care for any fevers, increasing pain worsening symptoms or urgent changes in your health.

## 2017-06-20 NOTE — ED Provider Notes (Signed)
[facility]    CSN: [id number] Arrival date & time: 06/20/17  1014     History   Chief Complaint Chief Complaint  Patient presents with  . Otalgia    HPI Kimberly Lynn is a 52 y.o. female.   Presents with 3-day history of left ear pain, pressure.  Pain is been moderate to severe.  She was treated with amoxicillin and prednisone for her left ear infection 1 month ago which improved but symptoms return.  She has not been taking any decongestant.  She denies any fevers.  No drainage in the left ear.  HPI  Past Medical History:  Diagnosis Date  . Arthritis   . Depression   . GERD (gastroesophageal reflux disease)   . Headache   . Hyperlipemia   . Hypertension     Patient Active Problem List   Diagnosis Date Noted  . Hyperlipidemia, unspecified 03/26/2017  . Morbid obesity with BMI of 45.0-49.9, adult ([place]) 09/25/2015  . Acute depression 03/23/2015  . Essential hypertension 07/27/2014  . Arthritis of knee, degenerative 07/26/2012  . Elevated LFTs 06/19/2011    Past Surgical History:  Procedure Laterality Date  . ABDOMINAL HYSTERECTOMY    . CESAREAN SECTION    . COLONOSCOPY WITH PROPOFOL N/A 06/04/2015   Procedure: COLONOSCOPY WITH PROPOFOL;  Surgeon: Manya Silvas, MD;  Location: [facility] ENDOSCOPY;  Service: Endoscopy;  Laterality: N/A;  . ESOPHAGOGASTRODUODENOSCOPY (EGD) WITH PROPOFOL N/A 06/04/2015   Procedure: ESOPHAGOGASTRODUODENOSCOPY (EGD) WITH PROPOFOL;  Surgeon: Manya Silvas, MD;  Location: [facility] ENDOSCOPY;  Service: Endoscopy;  Laterality: N/A;    OB History   None      Home Medications    Prior to Admission medications   Medication Sig Start Date End Date Taking? Authorizing Provider  acetaminophen (TYLENOL) 650 MG CR tablet Take by mouth.    [provider]  amoxicillin-clavulanate (AUGMENTIN) 875-125 MG tablet Take 1 tablet by mouth every 12 (twelve) hours for 10 days. 06/20/17 06/30/17  Duanne Guess, PA-C  aspirin EC 81 MG  tablet Take by mouth. 08/06/16   [provider]  Aspirin-Salicylamide-Caffeine (BC HEADACHE POWDER PO) Take 1 packet by mouth as needed.    [provider]  atenolol (TENORMIN) 50 MG tablet Take 50 mg by mouth daily.    [provider]  atenolol (TENORMIN) 50 MG tablet Take by mouth. 08/06/16 08/06/17  [provider]  buPROPion (WELLBUTRIN XL) 300 MG 24 hr tablet Take by mouth. 08/06/16 08/06/17  [provider]  hydrochlorothiazide (HYDRODIURIL) 12.5 MG tablet Take 12.5 mg by mouth daily.    [provider]  hydrochlorothiazide (HYDRODIURIL) 25 MG tablet Take by mouth. 08/06/16 08/06/17  [provider]  ibuprofen (ADVIL,MOTRIN) 200 MG tablet Take 200 mg by mouth every 6 (six) hours as needed.    [provider]  loratadine-pseudoephedrine (CLARITIN-D 12 HOUR) 5-120 MG tablet Take 1 tablet by mouth 2 (two) times daily. 06/20/17   Duanne Guess, PA-C  meloxicam (MOBIC) 15 MG tablet Take 1 tablet (15 mg total) by mouth daily. 04/02/17   Edrick Kins, DPM  nitroGLYCERIN (NITROSTAT) 0.4 MG SL tablet Place under the tongue. 08/06/16   [provider]  pantoprazole (PROTONIX) 40 MG tablet Take 1 tablet (40 mg total) by mouth daily. 08/06/16 08/06/17  Harvest Dark, MD  pantoprazole (PROTONIX) 40 MG tablet Take by mouth. 08/06/16   [provider]  ranitidine (ZANTAC) 75 MG tablet Take 75 mg by mouth 3 (three) times  daily.    [provider]    Family History History reviewed. No pertinent family history.  Social History Social History   Tobacco Use  . Smoking status: Current Every Day Smoker    Packs/day: 0.25    Types: Cigarettes  . Smokeless tobacco: Never Used  Substance Use Topics  . Alcohol use: No  . Drug use: No     Allergies   Patient has no known allergies.   Review of Systems Review of Systems  Constitutional: Negative for fever.  HENT: Positive for ear pain. Negative for  dental problem, ear discharge, facial swelling, trouble swallowing and voice change.   Gastrointestinal: Negative for nausea and vomiting.  Skin: Negative for color change and wound.  Neurological: Negative for dizziness, light-headedness and headaches.     Physical Exam Triage Vital Signs ED Triage Vitals  Enc Vitals Group     BP 06/20/17 1027 121/72     Pulse Rate 06/20/17 1027 (!) 55     Resp 06/20/17 1027 18     Temp 06/20/17 1027 98.1 F (36.7 C)     Temp Source 06/20/17 1027 Oral     SpO2 06/20/17 1027 100 %     Weight 06/20/17 1028 250 lb (113.4 kg)     Height 06/20/17 1028 5\' 2"  (1.575 m)     Head Circumference --      Peak Flow --      Pain Score 06/20/17 1028 8     Pain Loc --      Pain Edu? --      Excl. in [place]? --    No data found.  Updated Vital Signs BP 121/72 (BP Location: Right Arm)   Pulse (!) 55   Temp 98.1 F (36.7 C) (Oral)   Resp 18   Ht 5\' 2"  (1.575 m)   Wt 250 lb (113.4 kg)   SpO2 100%   BMI 45.73 kg/m   Visual Acuity Right Eye Distance:   Left Eye Distance:   Bilateral Distance:    Right Eye Near:   Left Eye Near:    Bilateral Near:     Physical Exam  Constitutional: She is oriented to person, place, and time. She appears well-developed and well-nourished.  HENT:  Head: Normocephalic and atraumatic.  Mouth/Throat: No oropharyngeal exudate.  Minimal cerumen left ear canal, positive otitis media with moderate erythema throughout the left TM with bulging.  TM is intact with no drainage.  Right TM normal.  Eyes: Conjunctivae are normal.  Neck: Normal range of motion.  Cardiovascular: Normal rate.  Pulmonary/Chest: Effort normal. No respiratory distress.  Musculoskeletal: Normal range of motion.  Lymphadenopathy:    She has no cervical adenopathy.  Neurological: She is alert and oriented to person, place, and time.  Skin: Skin is warm. No rash noted.  Psychiatric: She has a normal mood and affect. Her behavior is normal. Thought  content normal.     UC Treatments / Results  Labs (all labs ordered are listed, but only abnormal results are displayed) Labs Reviewed - No data to display  EKG None  Radiology No results found.  Procedures Procedures (including critical care time)  Medications Ordered in UC Medications - No data to display  Initial Impression / Assessment and Plan / UC Course  I have reviewed the triage vital signs and the nursing notes.  Pertinent labs & imaging results that were available during my care of the patient were reviewed by me and considered in my  medical decision making (see chart for details).     Recurrent otitis media left ear, start Augmentin.  She is placed on antihistamine decongestant to help alleviate pressure.  She understands signs symptoms return to the clinic for. Final Clinical Impressions(s) / UC Diagnoses   Final diagnoses:  Recurrent acute suppurative otitis media without spontaneous rupture of left tympanic membrane     Discharge Instructions     Please take medications as prescribed.  Follow-up PCP if no improvement in 5 to 7 days.  Return to the urgent care for any fevers, increasing pain worsening symptoms or urgent changes in your health.   ED Prescriptions    Medication Sig Dispense Auth. Provider   loratadine-pseudoephedrine (CLARITIN-D 12 HOUR) 5-120 MG tablet Take 1 tablet by mouth 2 (two) times daily. 10 tablet Duanne Guess, PA-C   amoxicillin-clavulanate (AUGMENTIN) 875-125 MG tablet Take 1 tablet by mouth every 12 (twelve) hours for 10 days. 20 tablet Renata Caprice       Duanne Guess, [place] 06/20/17 1116

## 2017-07-07 ENCOUNTER — Other Ambulatory Visit: Payer: Self-pay | Admitting: Student

## 2017-07-07 DIAGNOSIS — K219 Gastro-esophageal reflux disease without esophagitis: Secondary | ICD-10-CM

## 2017-07-16 ENCOUNTER — Ambulatory Visit: Admission: RE | Admit: 2017-07-16 | Payer: BLUE CROSS/BLUE SHIELD | Source: Ambulatory Visit

## 2018-10-15 ENCOUNTER — Other Ambulatory Visit: Payer: Self-pay | Admitting: Family Medicine

## 2018-10-15 DIAGNOSIS — Z1231 Encounter for screening mammogram for malignant neoplasm of breast: Secondary | ICD-10-CM

## 2018-10-15 DIAGNOSIS — R7303 Prediabetes: Secondary | ICD-10-CM | POA: Insufficient documentation

## 2018-11-02 DIAGNOSIS — M51369 Other intervertebral disc degeneration, lumbar region without mention of lumbar back pain or lower extremity pain: Secondary | ICD-10-CM | POA: Insufficient documentation

## 2018-11-02 DIAGNOSIS — M5136 Other intervertebral disc degeneration, lumbar region: Secondary | ICD-10-CM | POA: Insufficient documentation

## 2018-11-05 ENCOUNTER — Other Ambulatory Visit: Payer: Self-pay | Admitting: Orthopedic Surgery

## 2018-11-05 DIAGNOSIS — G8929 Other chronic pain: Secondary | ICD-10-CM

## 2018-11-05 DIAGNOSIS — M545 Low back pain, unspecified: Secondary | ICD-10-CM

## 2018-11-19 ENCOUNTER — Ambulatory Visit
Admission: RE | Admit: 2018-11-19 | Discharge: 2018-11-19 | Disposition: A | Discharge status: Home / Self Care | Payer: BC Managed Care – PPO | Source: Ambulatory Visit | Attending: Family Medicine | Admitting: Family Medicine

## 2018-11-19 DIAGNOSIS — Z1231 Encounter for screening mammogram for malignant neoplasm of breast: Secondary | ICD-10-CM | POA: Diagnosis present

## 2018-11-19 IMAGING — MG DIGITAL SCREENING BILAT W/ TOMO W/ CAD
8 of 15 series · 8 of 40 positions shown · non-contrast
Comparison: Previous exam(s).

CLINICAL DATA: Screening.

EXAM:
DIGITAL SCREENING BILATERAL MAMMOGRAM WITH TOMO AND CAD

[L MLO synth-2D]
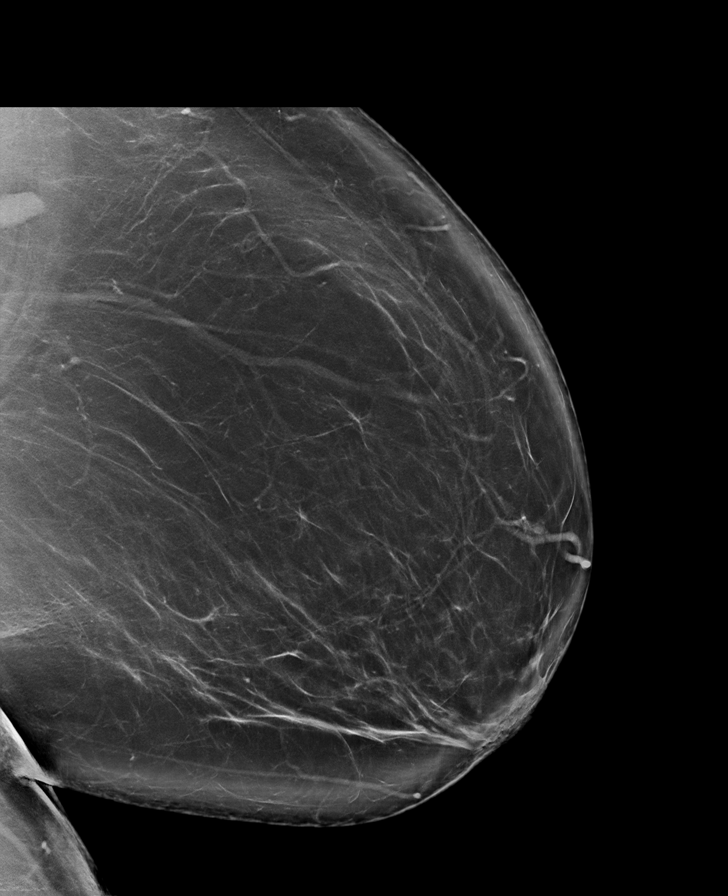

[R MLO synth-2D (1 of 2)]
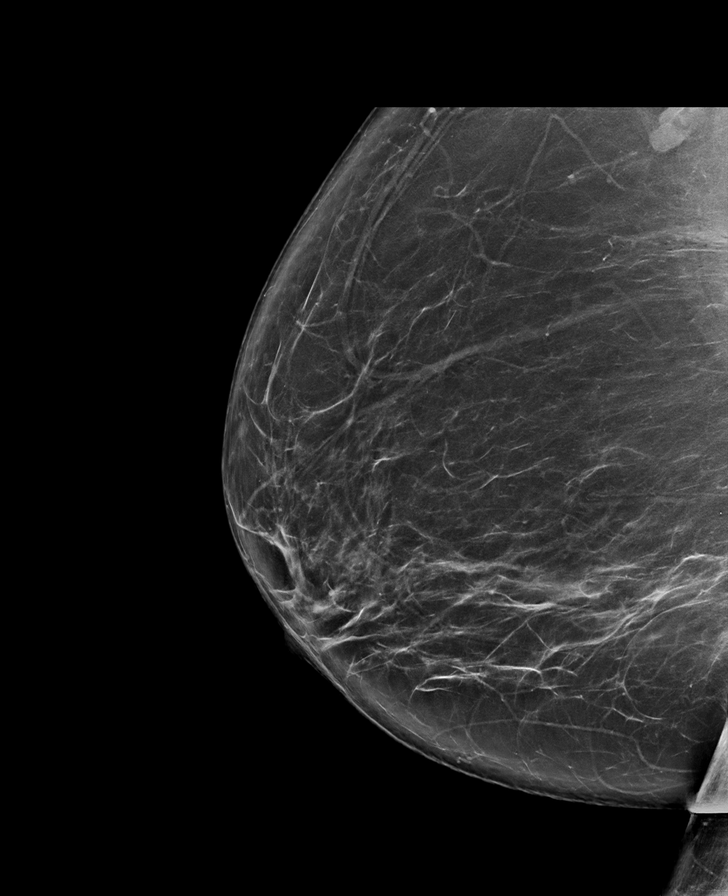

[L CC synth-2D]
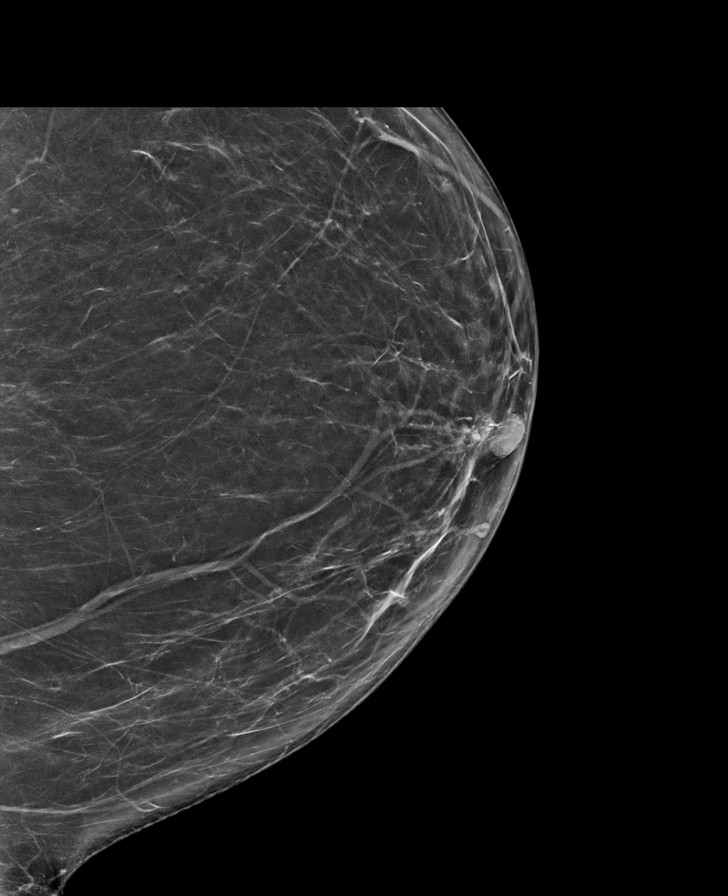

[R MLO synth-2D (2 of 2)]
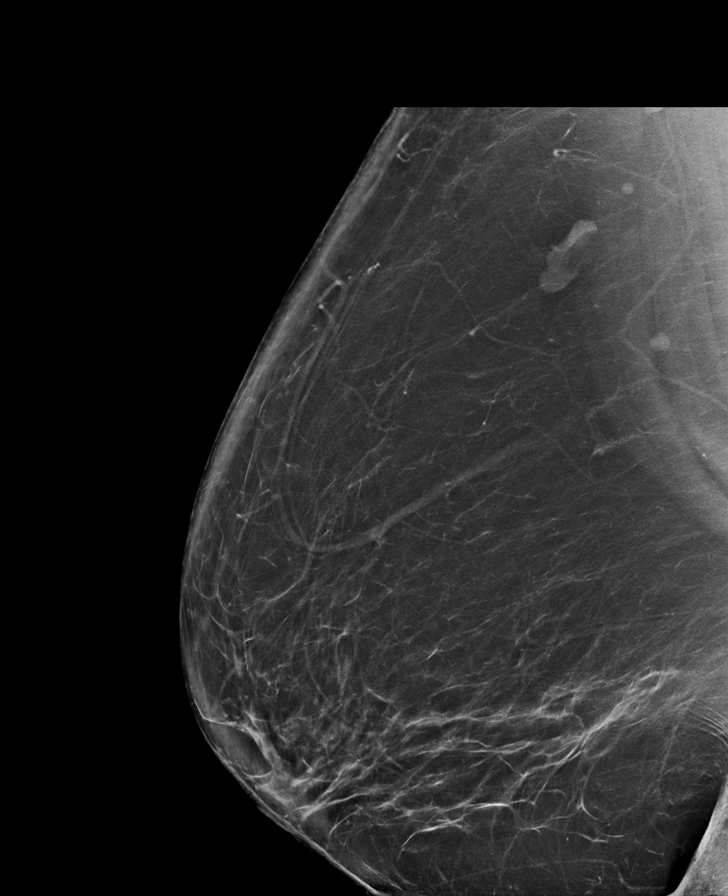

[L XCCL synth-2D]
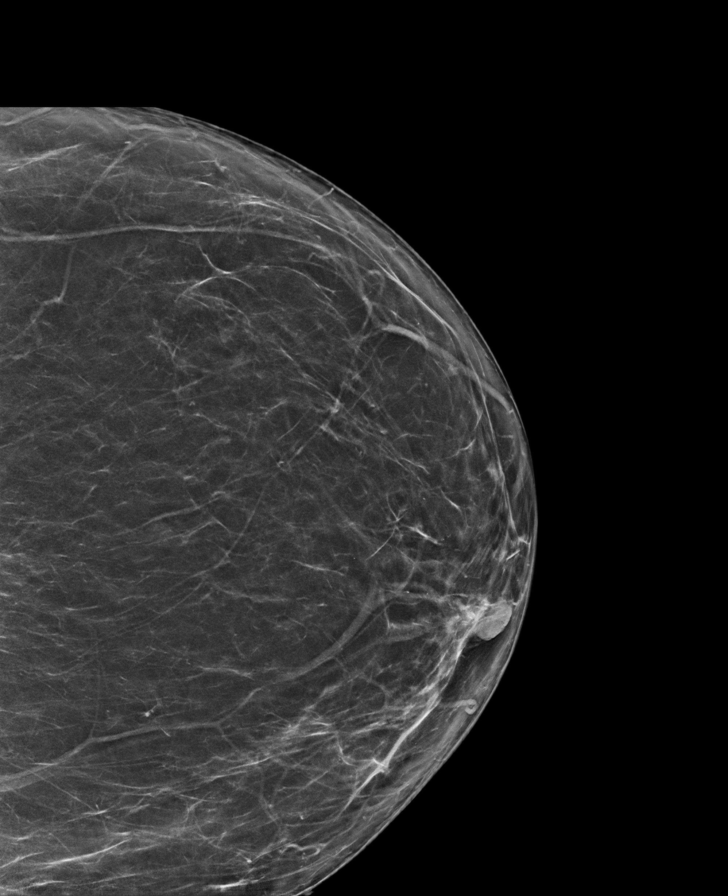

[R XCCL synth-2D]
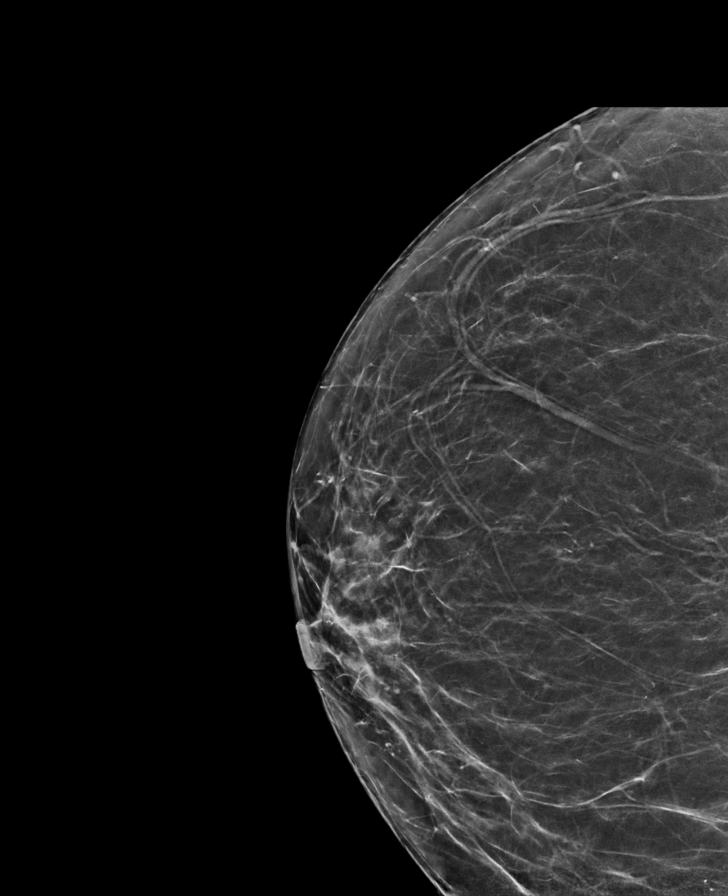

[R CC synth-2D]
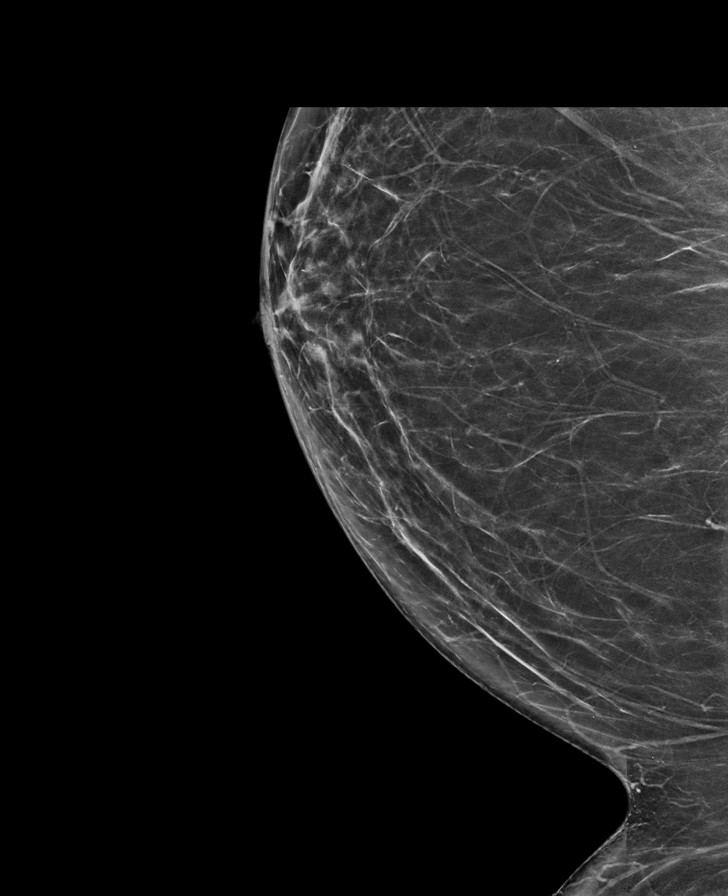

[R XCCL tomo · tomo slice 51/74.0]
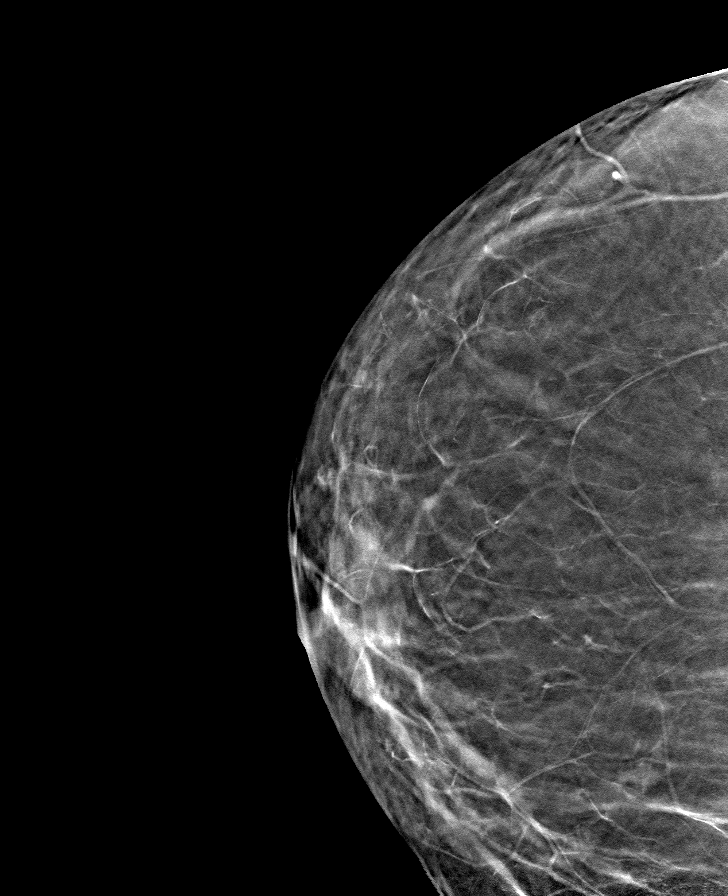

[8 of 40 positions shown; findings below may reference images not displayed]

ACR Breast Density Category b: There are scattered areas of
fibroglandular density.
FINDINGS: There are no findings suspicious for malignancy. Images were
processed with CAD.
IMPRESSION: No mammographic evidence of malignancy. A result letter of this
screening mammogram will be mailed directly to the patient.

RECOMMENDATION:
Screening mammogram in one year. (Code:[TQ])

BI-RADS CATEGORY  1: Negative.

## 2018-11-20 ENCOUNTER — Other Ambulatory Visit: Payer: BLUE CROSS/BLUE SHIELD

## 2018-12-06 ENCOUNTER — Other Ambulatory Visit: Payer: Self-pay | Admitting: Orthopedic Surgery

## 2018-12-06 DIAGNOSIS — M545 Low back pain, unspecified: Secondary | ICD-10-CM

## 2018-12-06 DIAGNOSIS — M5136 Other intervertebral disc degeneration, lumbar region: Secondary | ICD-10-CM

## 2018-12-06 DIAGNOSIS — M48062 Spinal stenosis, lumbar region with neurogenic claudication: Secondary | ICD-10-CM

## 2018-12-06 DIAGNOSIS — G8929 Other chronic pain: Secondary | ICD-10-CM

## 2018-12-29 ENCOUNTER — Other Ambulatory Visit: Payer: Self-pay | Admitting: Orthopedic Surgery

## 2019-01-01 ENCOUNTER — Other Ambulatory Visit: Payer: Self-pay

## 2019-01-01 ENCOUNTER — Ambulatory Visit
Admission: RE | Admit: 2019-01-01 | Discharge: 2019-01-01 | Disposition: A | Discharge status: Home / Self Care | Payer: BC Managed Care – PPO | Source: Ambulatory Visit | Attending: Orthopedic Surgery | Admitting: Orthopedic Surgery

## 2019-01-01 DIAGNOSIS — M545 Low back pain, unspecified: Secondary | ICD-10-CM

## 2019-01-01 DIAGNOSIS — M5136 Other intervertebral disc degeneration, lumbar region: Secondary | ICD-10-CM

## 2019-01-01 DIAGNOSIS — M5442 Lumbago with sciatica, left side: Secondary | ICD-10-CM

## 2019-01-01 DIAGNOSIS — M48062 Spinal stenosis, lumbar region with neurogenic claudication: Secondary | ICD-10-CM

## 2019-01-01 DIAGNOSIS — G8929 Other chronic pain: Secondary | ICD-10-CM

## 2019-01-01 IMAGING — MR MR LUMBAR SPINE W/O CM
4 of 5 series · 26 of 48 positions shown · non-contrast
Comparison: None.

CLINICAL DATA: Low back pain and bilateral leg pain and numbness.
Symptoms began several years ago but are worsening.

EXAM:
MRI LUMBAR SPINE WITHOUT CONTRAST
TECHNIQUE: Multiplanar, multisequence MR imaging of the lumbar spine was
performed. No intravenous contrast was administered.

[Series 3: T2 post-contrast · sagittal · 4.0mm · 0.55mm/px · 6 of 13 slices shown]
[im 1/13]
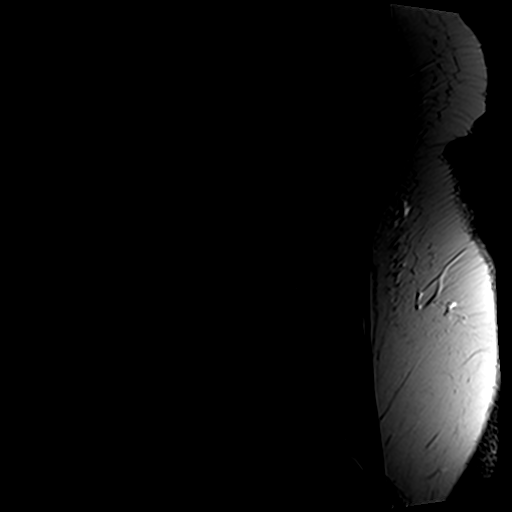
[im 3/13]
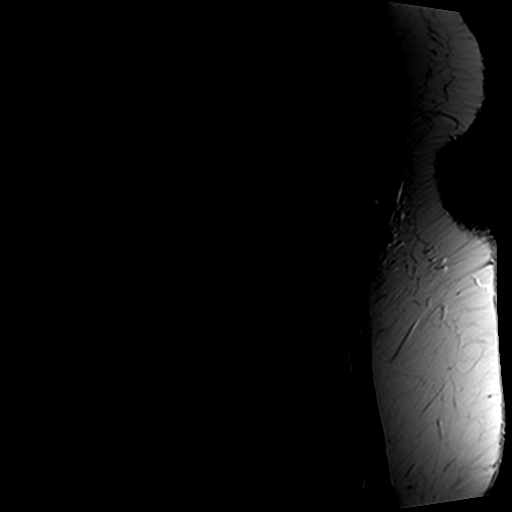
[im 5/13]
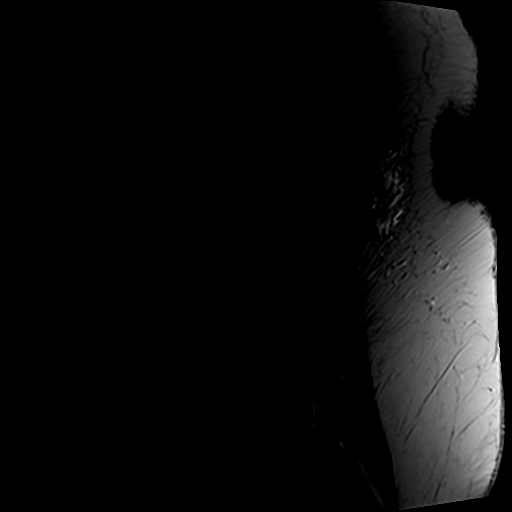
[im 8/13]
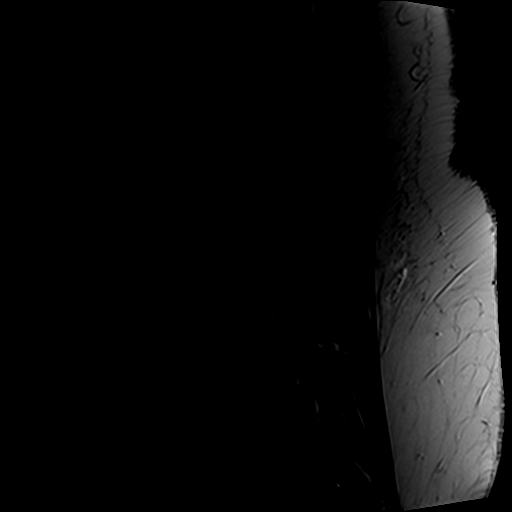
[im 10/13]
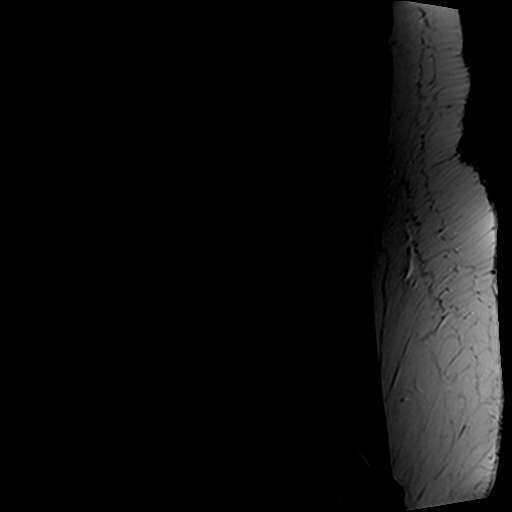
[im 13/13]
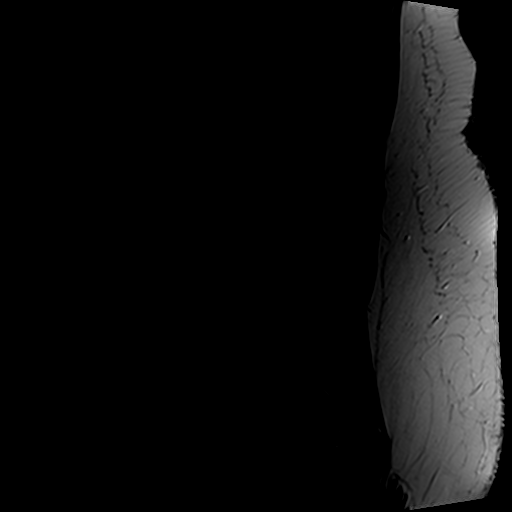

[Series 5: T1 · sagittal · 4.0mm · 0.55mm/px · 5 of 13 slices shown (1 of 2)]
[im 1/13]
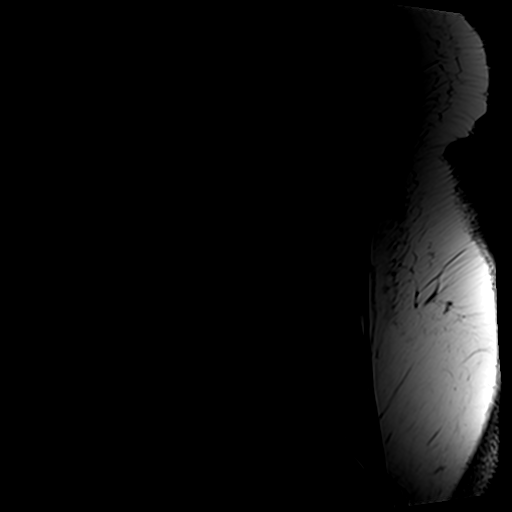
[im 4/13]
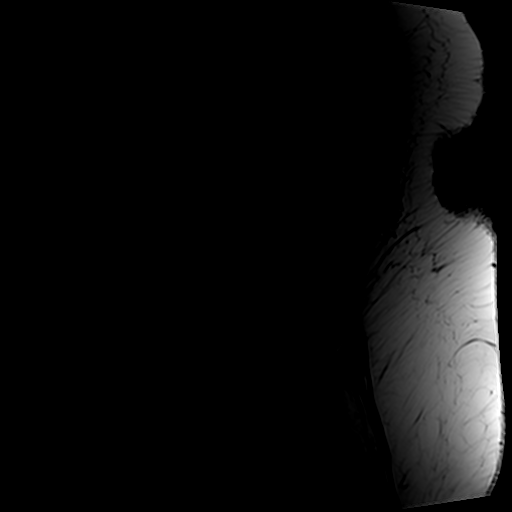
[im 7/13]
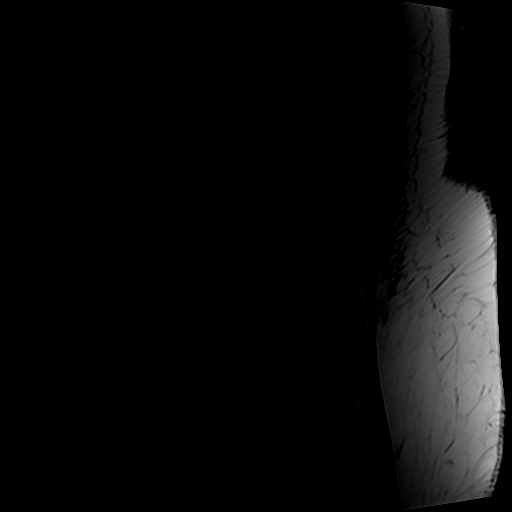
[im 10/13]
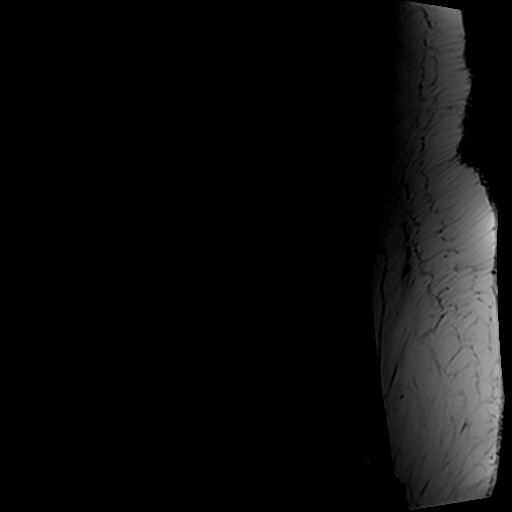
[im 13/13]
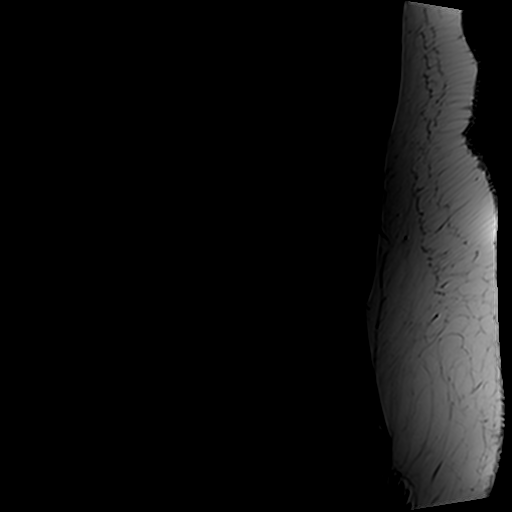

[Series 6: T2 · axial · 4.0mm · 0.78mm/px · z∈[-94,+108]mm · 10 of 38 slices shown]
[im 3/38]
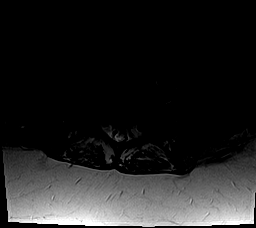
[im 5/38]
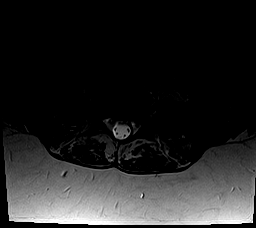
[im 8/38]
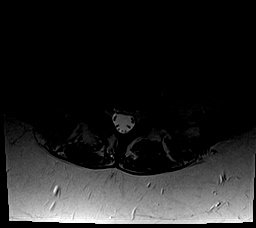
[im 13/38]
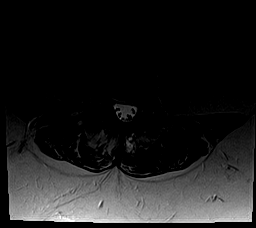
[im 18/38]
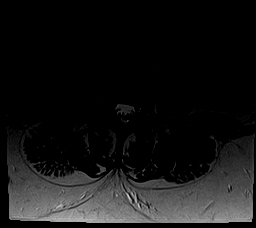
[im 20/38]
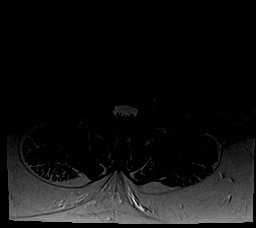
[im 23/38]
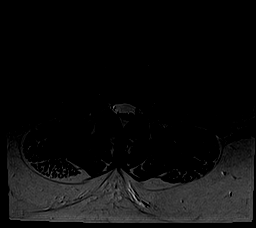
[im 28/38]
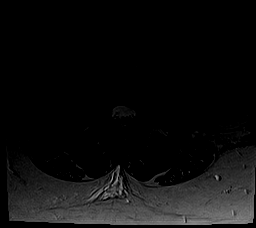
[im 33/38]
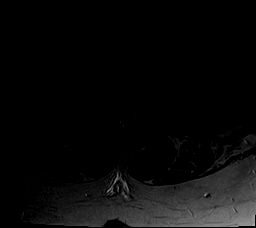
[im 38/38]
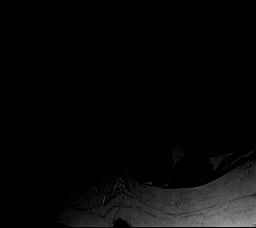

[Series 7: T1 · axial · 4.0mm · 0.39mm/px · z∈[-94,+82]mm · 5 of 38 slices shown (2 of 2)]
[im 3/38]
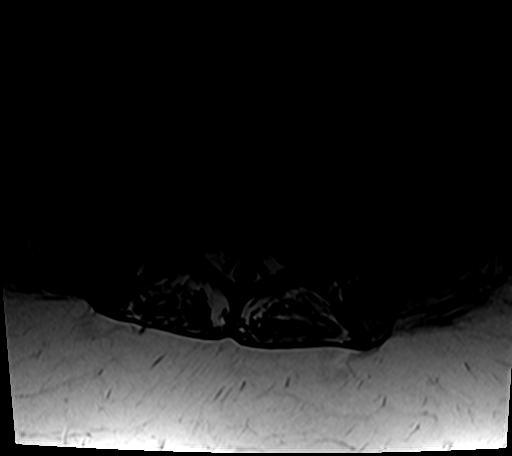
[im 5/38]
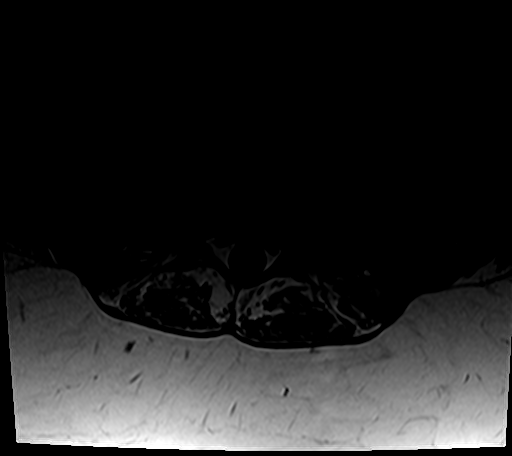
[im 8/38]
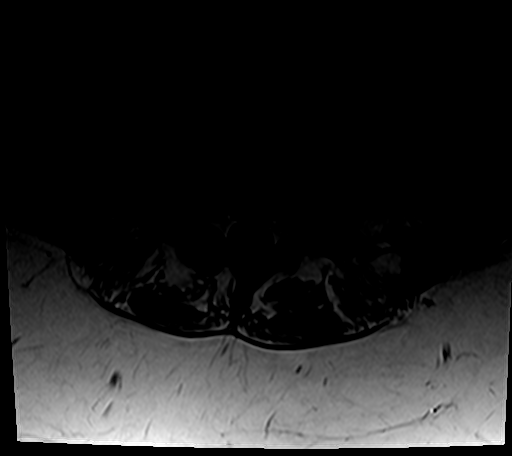
[im 20/38]
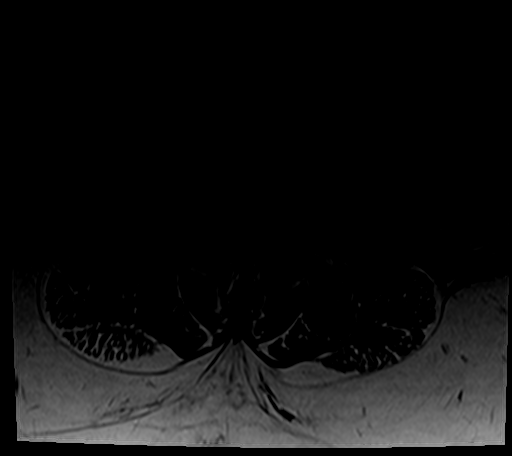
[im 33/38]
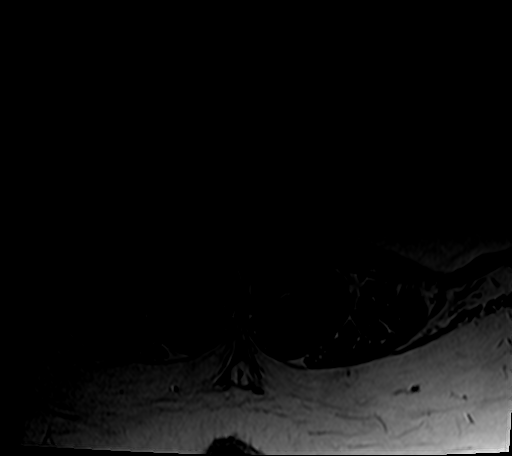

[26 of 48 positions shown; findings below may reference images not displayed]

FINDINGS: Segmentation: 5 lumbar type vertebral bodies assumed. S1 has some
transitional features.

Alignment:  2 mm degenerative anterolisthesis L5-S1.

Vertebrae:  No fracture or primary bone lesion.

Conus medullaris and cauda equina: Conus extends to the L1-2 level.
Conus and cauda equina appear normal.

Paraspinal and other soft tissues: Negative

Disc levels:

L1-2: Endplate osteophytes and chronic appearing disc herniation
with caudal migration in the right lateral recess. Mild narrowing of
the right lateral recess but no apparent neural compression.

L2-3: Shallow disc protrusion in the left posterolateral direction.
Slight indentation of the thecal sac but no compressive stenosis.

L3-4: Normal interspace.

L4-5: Bilateral facet arthropathy with joint effusions right more
than left. Minimal disc desiccation without bulge or herniation. No
stenosis. This appearance could worsen with standing or flexion.
Facet arthropathy could certainly be painful.

L5-S1: Chronic facet arthropathy with 2 mm of anterolisthesis. Mild
desiccation of the disc but no bulge or herniation. No stenosis.
Facet arthropathy could be painful.
IMPRESSION: L4-5: Bilateral facet arthropathy with effusions and edema. No
compressive stenosis. Anterolisthesis could occur at this level with
standing or flexion. The facet arthropathy is probably symptomatic
and could be associated with back pain and or referred facet
syndrome pain.

L5-S1: Chronic facet osteoarthritis with 2 mm of anterolisthesis. No
compressive stenosis. The facet arthropathy could be painful.

L1-2: Chronic appearing right posterolateral disc herniation with
slight caudal migration in the right lateral recess. No compressive
stenosis.

L2-3: Chronic appearing shallow left posterolateral disc protrusion
without compressive stenosis.

## 2019-01-12 ENCOUNTER — Other Ambulatory Visit: Payer: Self-pay | Admitting: Orthopedic Surgery

## 2019-01-12 DIAGNOSIS — M48062 Spinal stenosis, lumbar region with neurogenic claudication: Secondary | ICD-10-CM

## 2019-01-13 ENCOUNTER — Other Ambulatory Visit: Payer: Self-pay

## 2019-01-13 ENCOUNTER — Ambulatory Visit: Payer: BC Managed Care – PPO | Attending: Internal Medicine

## 2019-01-13 DIAGNOSIS — Z20822 Contact with and (suspected) exposure to covid-19: Secondary | ICD-10-CM

## 2019-01-15 LAB — NOVEL CORONAVIRUS, NAA: SARS-CoV-2, NAA: NOT DETECTED

## 2019-01-18 ENCOUNTER — Other Ambulatory Visit: Payer: BC Managed Care – PPO

## 2019-01-24 ENCOUNTER — Other Ambulatory Visit: Payer: Self-pay

## 2019-01-24 ENCOUNTER — Ambulatory Visit
Admission: RE | Admit: 2019-01-24 | Discharge: 2019-01-24 | Disposition: A | Discharge status: Home / Self Care | Payer: BC Managed Care – PPO | Source: Ambulatory Visit | Attending: Orthopedic Surgery | Admitting: Orthopedic Surgery

## 2019-01-24 DIAGNOSIS — M48062 Spinal stenosis, lumbar region with neurogenic claudication: Secondary | ICD-10-CM

## 2019-01-24 IMAGING — XA Imaging study
2 series · 2 of 2 positions shown · non-contrast
Comparison: none

CLINICAL DATA: Lumbosacral spondylosis without myelopathy. Chronic
low back pain radiating into both legs, worse on the right. L4-L5
epidural injection requested. Transitional anatomy.

[Series 1: ortho adipose · 1 of 1 slices shown (1 of 2)]
[im 1/1]
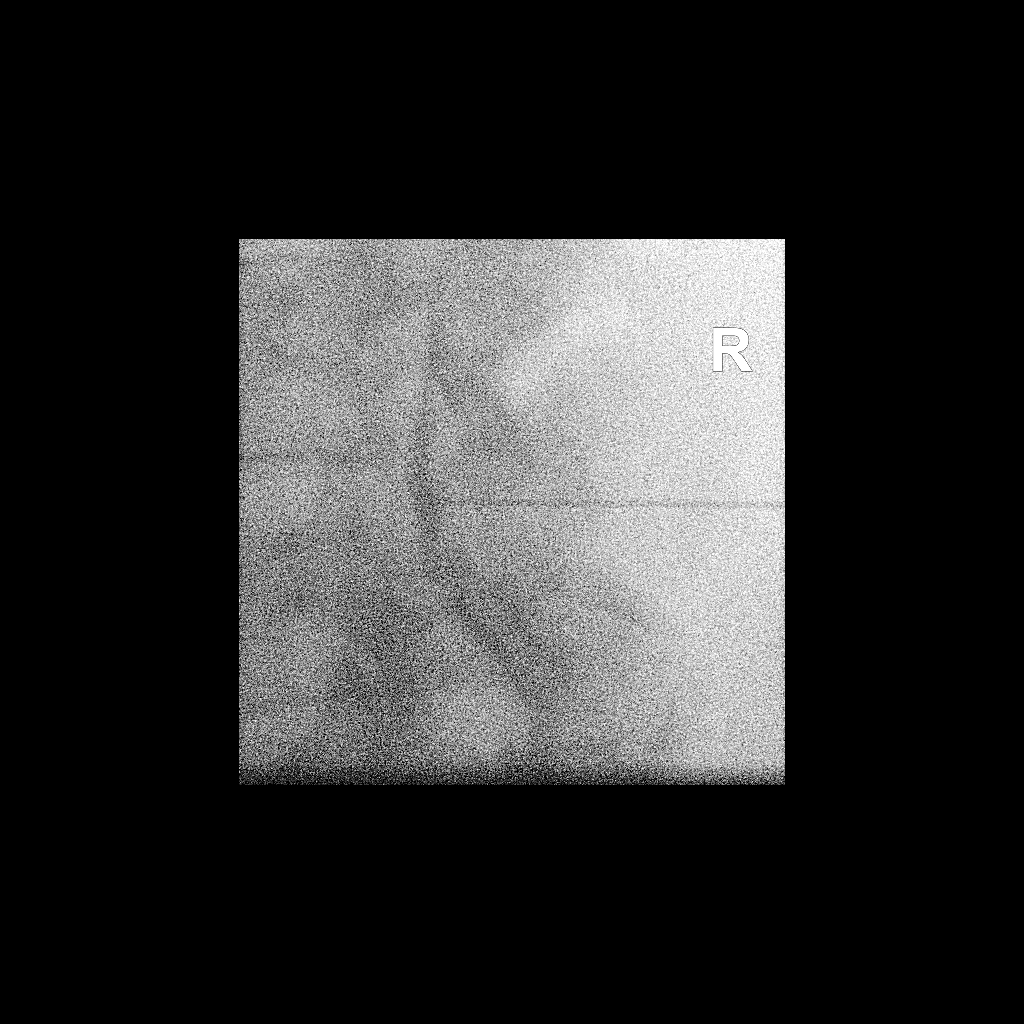

[Series 2: ortho adipose · 1 of 1 slices shown (2 of 2)]
[im 1/1]
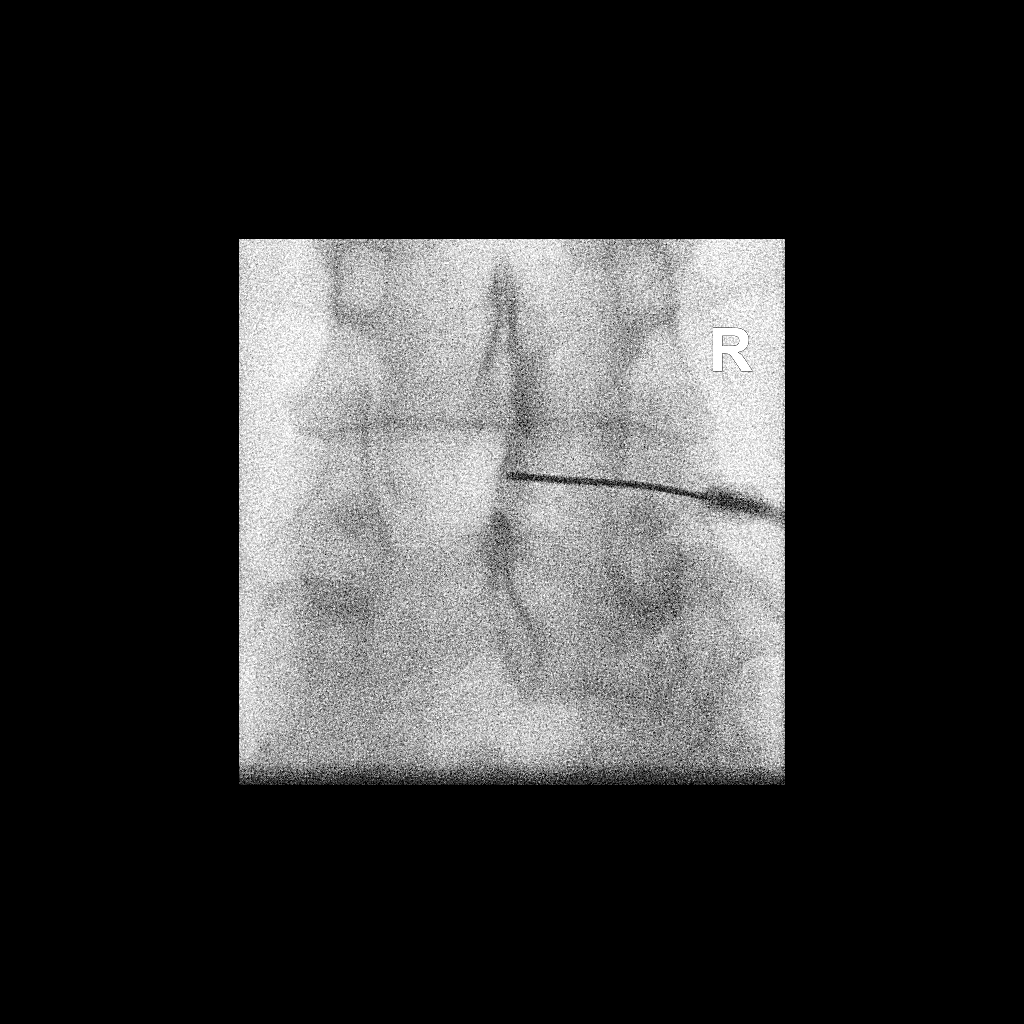

[2 of 2 positions shown; findings below may reference images not displayed]

FLUOROSCOPY TIME:  Radiation Exposure Index (as provided by the
fluoroscopic device): 5.9 mGy

Fluoroscopy Time:  5 seconds

Number of Acquired Images:  0

PROCEDURE:
The procedure, risks, benefits, and alternatives were explained to
the patient. Questions regarding the procedure were encouraged and
answered. The patient understands and consents to the procedure.

LUMBAR EPIDURAL INJECTION:

An interlaminar approach was performed on the right at L4-L5. The
overlying skin was cleansed and anesthetized. A 6 inch 20 gauge
epidural needle was advanced using loss-of-resistance technique.

DIAGNOSTIC EPIDURAL INJECTION:

Injection of Isovue-M 200 shows a good epidural pattern with spread
above and below the level of needle placement, primarily on the
right. No vascular opacification is seen.

THERAPEUTIC EPIDURAL INJECTION:

120 mg of Depo-Medrol mixed with 3 mL of 1% lidocaine were
instilled. The procedure was well-tolerated, and the patient was
discharged thirty minutes following the injection in good condition.

COMPLICATIONS:
None immediate.
IMPRESSION: Technically successful interlaminar epidural injection on the right
at L4-L5.

## 2019-01-24 MED ORDER — METHYLPREDNISOLONE ACETATE 40 MG/ML INJ SUSP (RADIOLOG
120.0000 mg | Freq: Once | INTRAMUSCULAR | Status: AC
  Administered 2019-01-24: 120 mg via EPIDURAL

## 2019-01-24 MED ORDER — IOPAMIDOL (ISOVUE-M 200) INJECTION 41%
1.0000 mL | Freq: Once | INTRAMUSCULAR | Status: AC
  Administered 2019-01-24: 1 mL via EPIDURAL

## 2019-01-24 NOTE — Discharge Instructions (Signed)

## 2019-01-28 DIAGNOSIS — N200 Calculus of kidney: Secondary | ICD-10-CM

## 2019-01-28 HISTORY — DX: Calculus of kidney: N20.0

## 2019-03-18 ENCOUNTER — Ambulatory Visit
Admission: RE | Admit: 2019-03-18 | Discharge: 2019-03-18 | Disposition: A | Discharge status: Home / Self Care | Payer: BC Managed Care – PPO | Attending: Physician Assistant | Admitting: Physician Assistant

## 2019-03-18 ENCOUNTER — Ambulatory Visit
Admission: RE | Admit: 2019-03-18 | Discharge: 2019-03-18 | Disposition: A | Discharge status: Home / Self Care | Payer: BC Managed Care – PPO | Source: Ambulatory Visit | Attending: Physician Assistant | Admitting: Physician Assistant

## 2019-03-18 ENCOUNTER — Other Ambulatory Visit: Payer: Self-pay | Admitting: Physician Assistant

## 2019-03-18 ENCOUNTER — Other Ambulatory Visit: Payer: Self-pay

## 2019-03-18 DIAGNOSIS — M79674 Pain in right toe(s): Secondary | ICD-10-CM | POA: Diagnosis present

## 2019-03-18 DIAGNOSIS — M7989 Other specified soft tissue disorders: Secondary | ICD-10-CM | POA: Insufficient documentation

## 2019-03-18 IMAGING — CR DG FOOT COMPLETE 3+V*R*
3 series · 3 of 3 positions shown · non-contrast
Comparison: Right foot radiographs [DATE].

CLINICAL DATA: Pain in the first MTP joint.

EXAM:
RIGHT FOOT COMPLETE - 3+ VIEW

[foot ap]
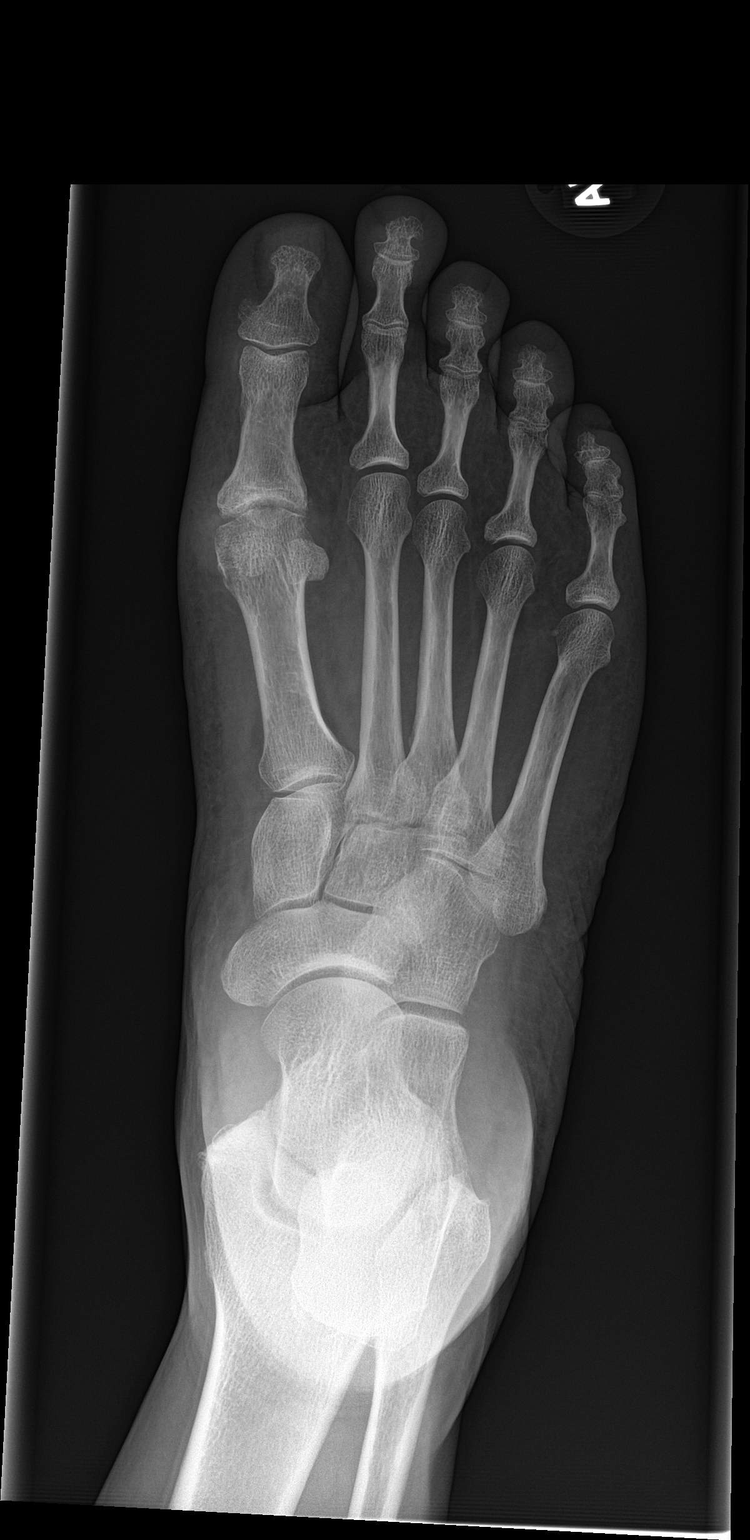

[foot obl]
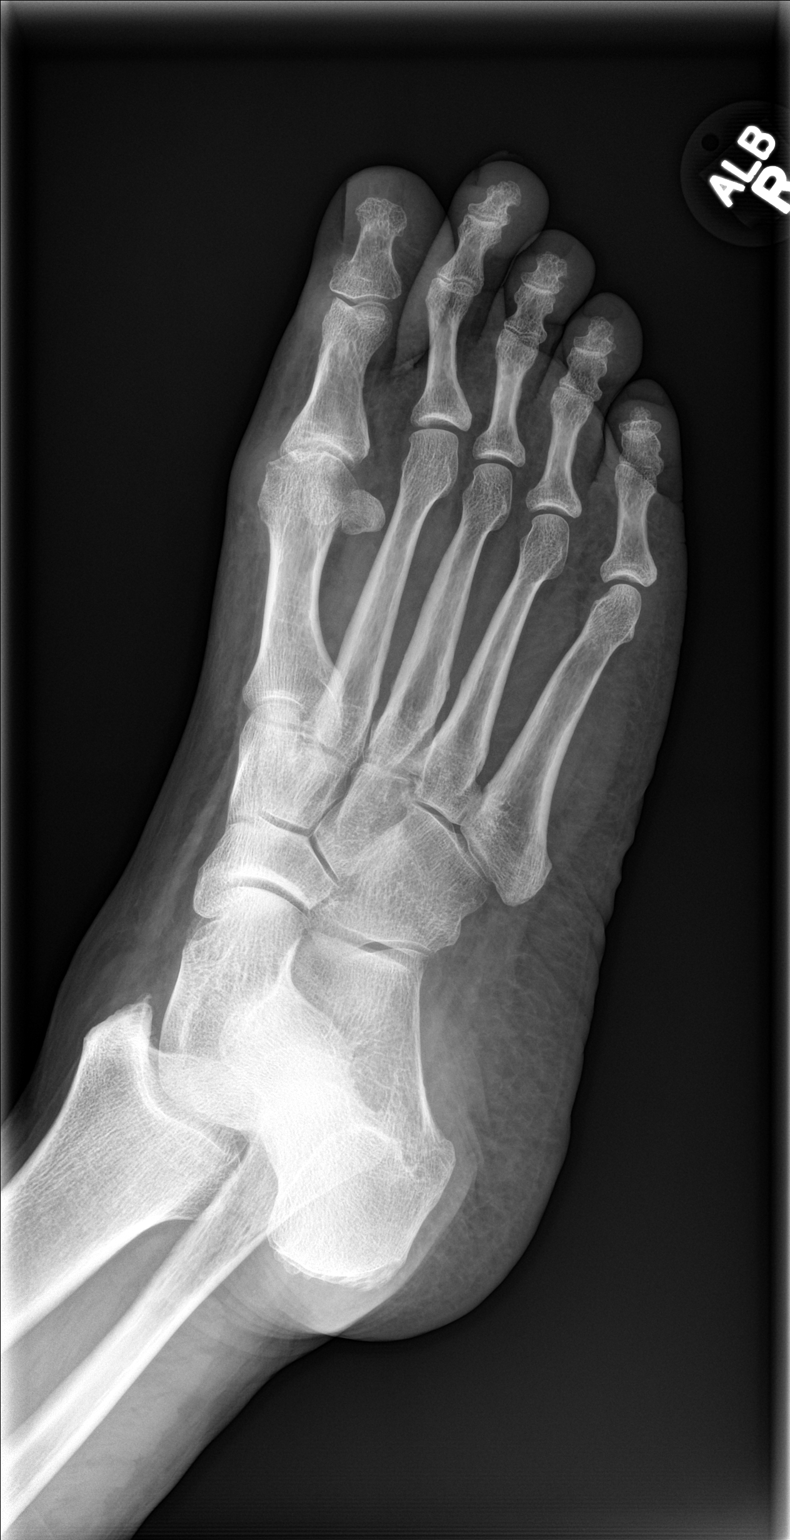

[foot lat]
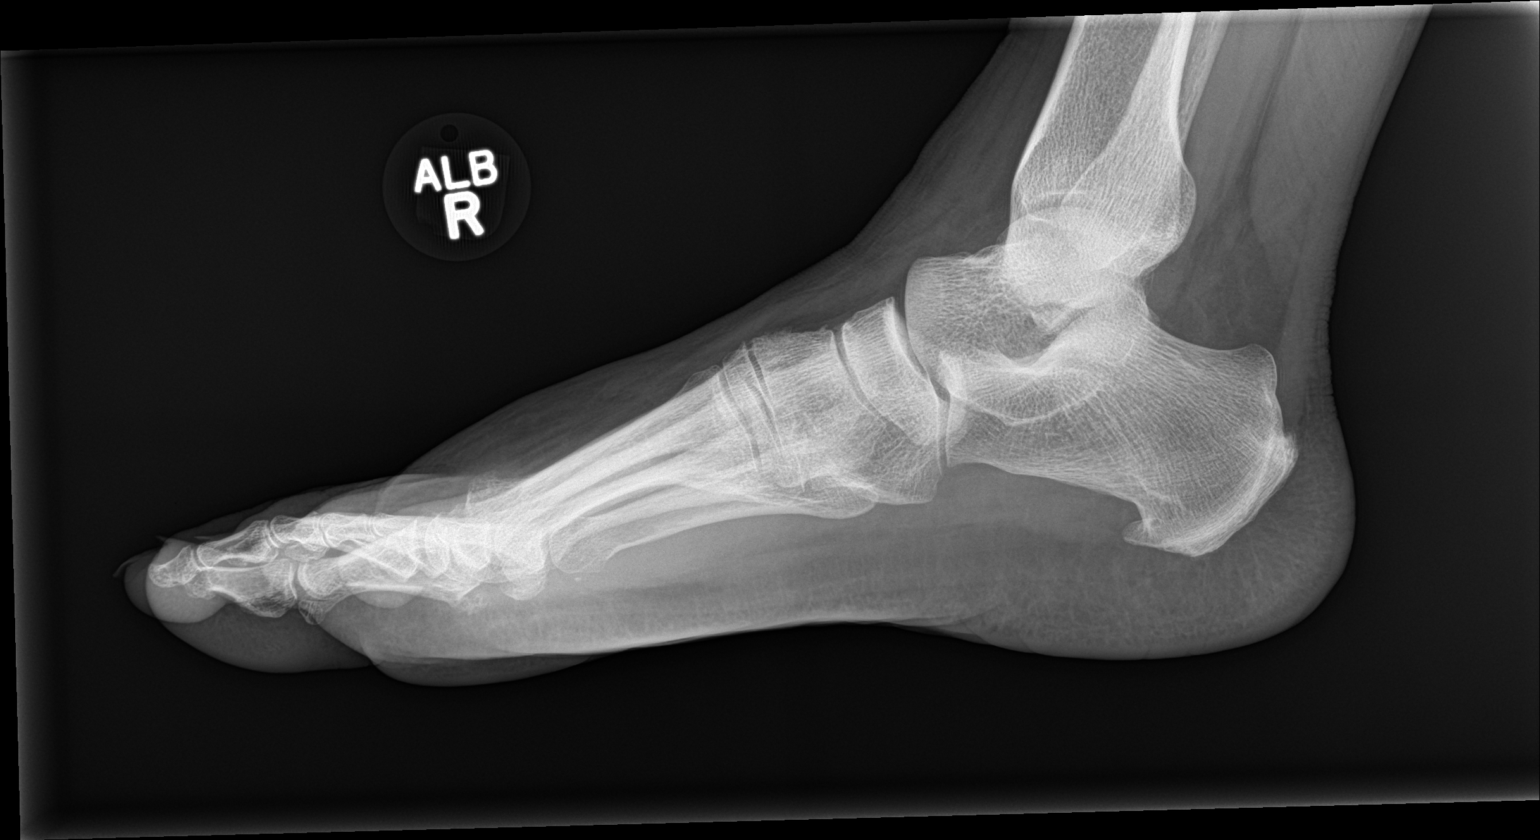

[3 of 3 positions shown; findings below may reference images not displayed]

FINDINGS: Soft tissue swelling is present about the first MTP joint. There is
loss of joint space and erosive change.
IMPRESSION: Soft tissue swelling about the first MTP joint with loss of joint
space and erosive change. Findings are concerning for gout or other
inflammatory arthritis.

## 2019-12-08 DIAGNOSIS — I214 Non-ST elevation (NSTEMI) myocardial infarction: Secondary | ICD-10-CM

## 2019-12-08 HISTORY — DX: Non-ST elevation (NSTEMI) myocardial infarction: I21.4

## 2019-12-09 DIAGNOSIS — I5189 Other ill-defined heart diseases: Secondary | ICD-10-CM

## 2019-12-09 DIAGNOSIS — I214 Non-ST elevation (NSTEMI) myocardial infarction: Secondary | ICD-10-CM | POA: Insufficient documentation

## 2019-12-09 DIAGNOSIS — R001 Bradycardia, unspecified: Secondary | ICD-10-CM | POA: Insufficient documentation

## 2019-12-09 DIAGNOSIS — Z720 Tobacco use: Secondary | ICD-10-CM | POA: Insufficient documentation

## 2019-12-09 HISTORY — DX: Other ill-defined heart diseases: I51.89

## 2020-02-10 DIAGNOSIS — M1A9XX0 Chronic gout, unspecified, without tophus (tophi): Secondary | ICD-10-CM | POA: Insufficient documentation

## 2020-02-10 DIAGNOSIS — F419 Anxiety disorder, unspecified: Secondary | ICD-10-CM | POA: Insufficient documentation

## 2020-07-17 ENCOUNTER — Other Ambulatory Visit: Payer: Self-pay | Admitting: Orthopedic Surgery

## 2020-07-17 DIAGNOSIS — M4807 Spinal stenosis, lumbosacral region: Secondary | ICD-10-CM

## 2020-07-17 DIAGNOSIS — M5441 Lumbago with sciatica, right side: Secondary | ICD-10-CM

## 2020-07-17 DIAGNOSIS — M5136 Other intervertebral disc degeneration, lumbar region: Secondary | ICD-10-CM

## 2020-07-17 DIAGNOSIS — G8929 Other chronic pain: Secondary | ICD-10-CM

## 2020-07-17 DIAGNOSIS — M48062 Spinal stenosis, lumbar region with neurogenic claudication: Secondary | ICD-10-CM

## 2020-07-26 ENCOUNTER — Ambulatory Visit
Admission: RE | Admit: 2020-07-26 | Discharge: 2020-07-26 | Disposition: A | Discharge status: Home / Self Care | Payer: BC Managed Care – PPO | Source: Ambulatory Visit | Attending: Orthopedic Surgery | Admitting: Orthopedic Surgery

## 2020-07-26 ENCOUNTER — Other Ambulatory Visit: Payer: Self-pay

## 2020-07-26 DIAGNOSIS — G8929 Other chronic pain: Secondary | ICD-10-CM | POA: Insufficient documentation

## 2020-07-26 DIAGNOSIS — M5441 Lumbago with sciatica, right side: Secondary | ICD-10-CM | POA: Diagnosis present

## 2020-07-26 DIAGNOSIS — M5136 Other intervertebral disc degeneration, lumbar region: Secondary | ICD-10-CM | POA: Diagnosis present

## 2020-07-26 DIAGNOSIS — M48062 Spinal stenosis, lumbar region with neurogenic claudication: Secondary | ICD-10-CM | POA: Diagnosis present

## 2020-07-26 DIAGNOSIS — M4807 Spinal stenosis, lumbosacral region: Secondary | ICD-10-CM | POA: Insufficient documentation

## 2020-07-26 IMAGING — MR MR LUMBAR SPINE W/O CM
5 series · 30 of 48 positions shown · non-contrast
Comparison: [DATE].

CLINICAL DATA: Back pain.  Difficulty walking.

EXAM:
MRI LUMBAR SPINE WITHOUT CONTRAST
TECHNIQUE: Multiplanar, multisequence MR imaging of the lumbar spine was
performed. No intravenous contrast was administered.

[Series 5: T2 · sagittal · 4.0mm · 0.81mm/px · 6 of 17 slices shown (1 of 2)]
[im 1/17]
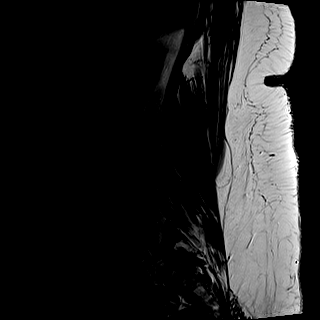
[im 4/17]
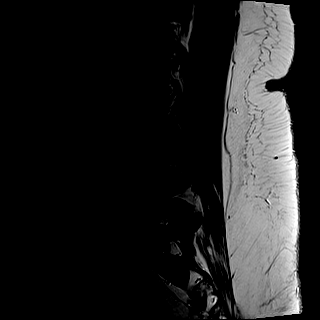
[im 7/17]
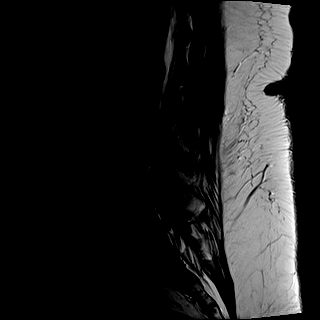
[im 10/17]
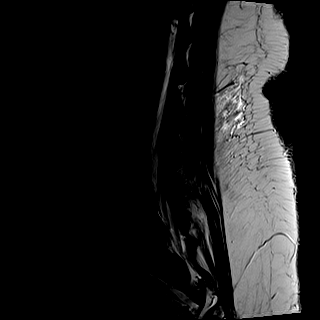
[im 13/17]
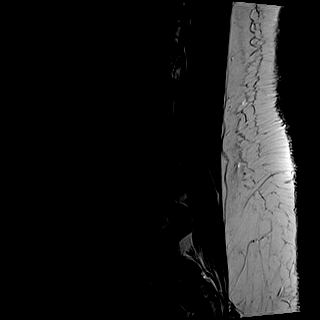
[im 17/17]
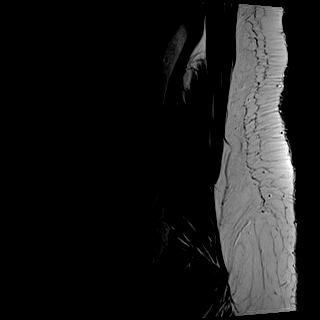

[Series 6: T1 · sagittal · 4.0mm · 0.81mm/px · 7 of 17 slices shown (1 of 2)]
[im 1/17]
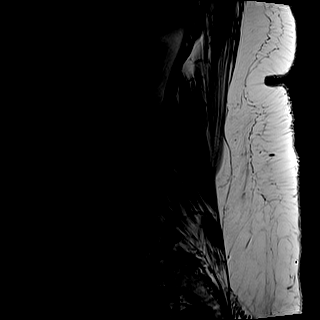
[im 3/17]
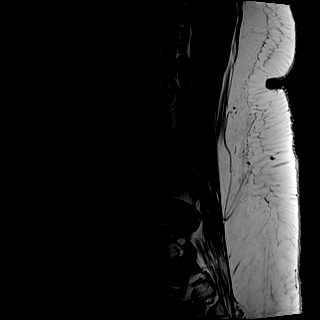
[im 6/17]
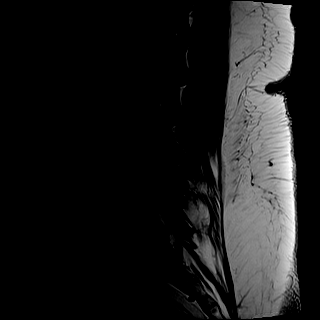
[im 9/17]
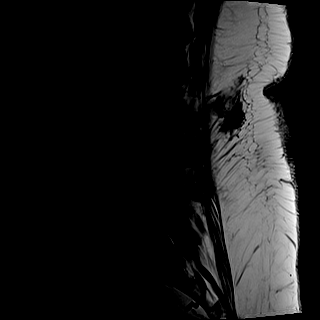
[im 11/17]
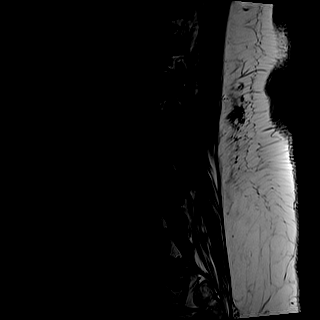
[im 14/17]
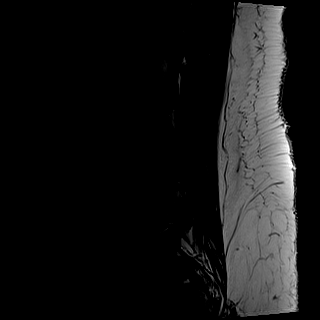
[im 17/17]
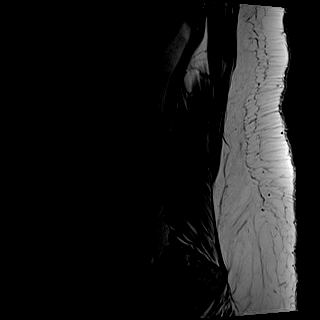

[Series 7: STIR · sagittal · 4.0mm · 0.41mm/px · 1 of 17 slices shown]
[im 1/17]
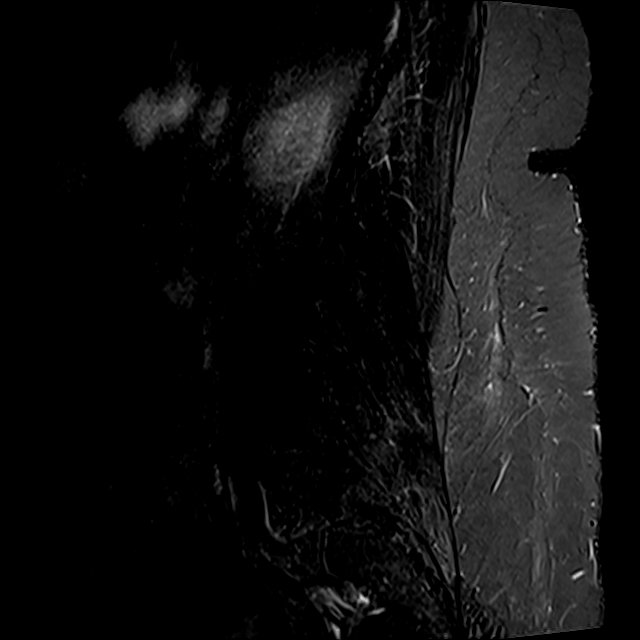

[Series 8: T2 · axial · 4.0mm · 0.78mm/px · z∈[-102,+106]mm · 8 of 36 slices shown (2 of 2)]
[im 1/36]
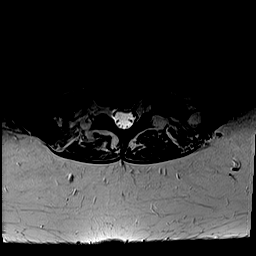
[im 6/36]
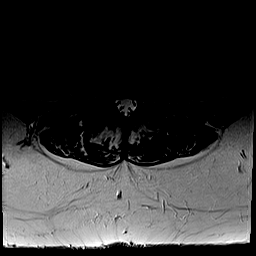
[im 11/36]
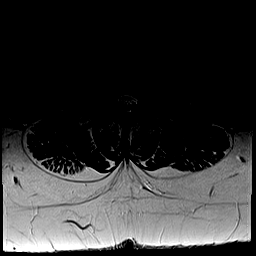
[im 17/36]
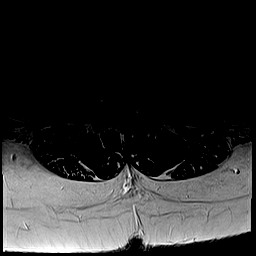
[im 19/36]
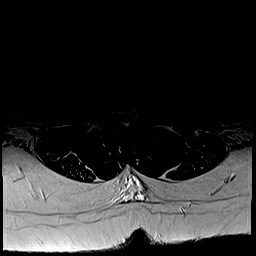
[im 25/36]
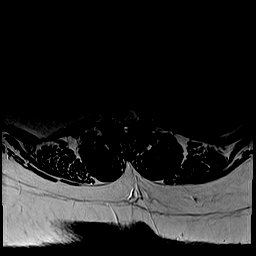
[im 30/36]
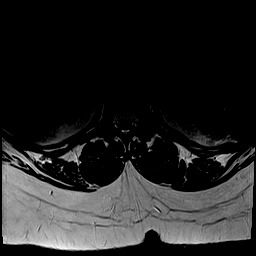
[im 36/36]
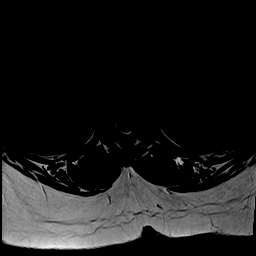

[Series 9: T1 · axial · 4.0mm · 0.39mm/px · z∈[-102,+106]mm · 8 of 36 slices shown (2 of 2)]
[im 1/36]
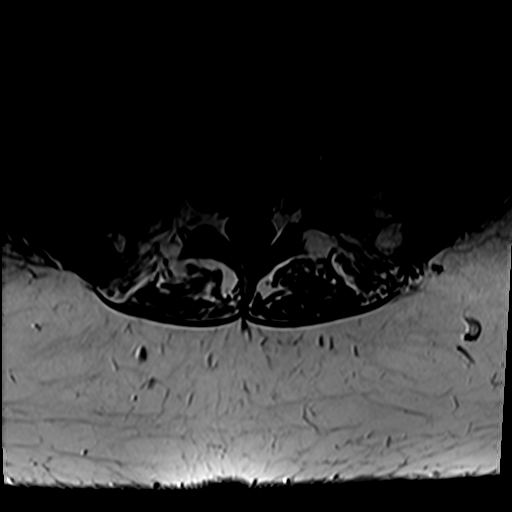
[im 6/36]
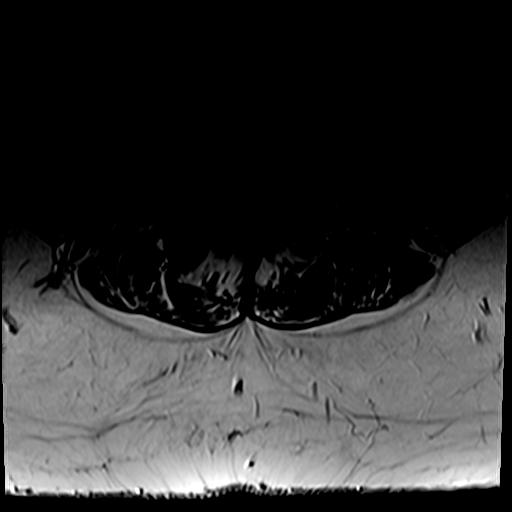
[im 11/36]
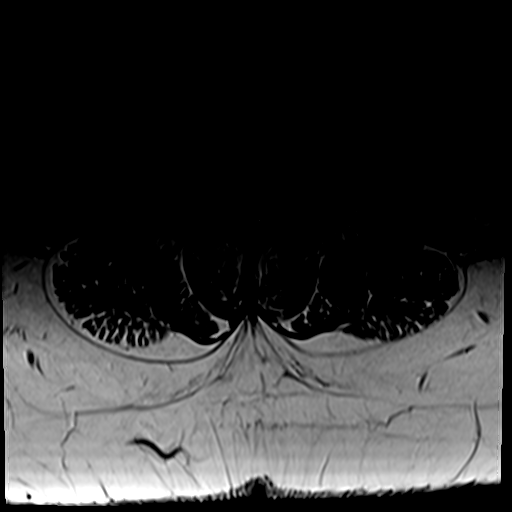
[im 17/36]
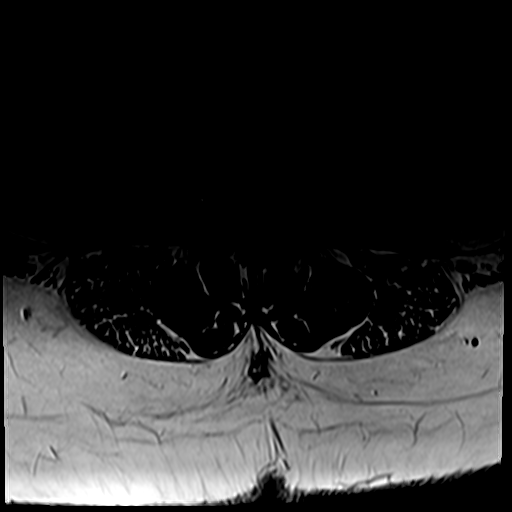
[im 19/36]
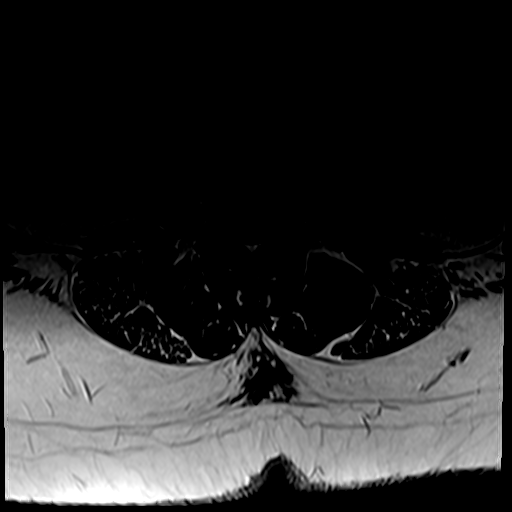
[im 25/36]
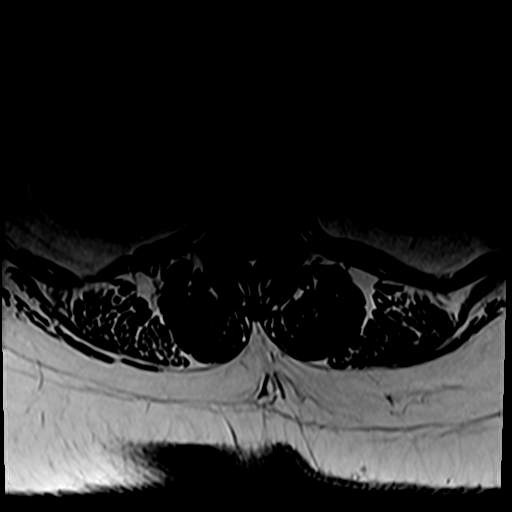
[im 30/36]
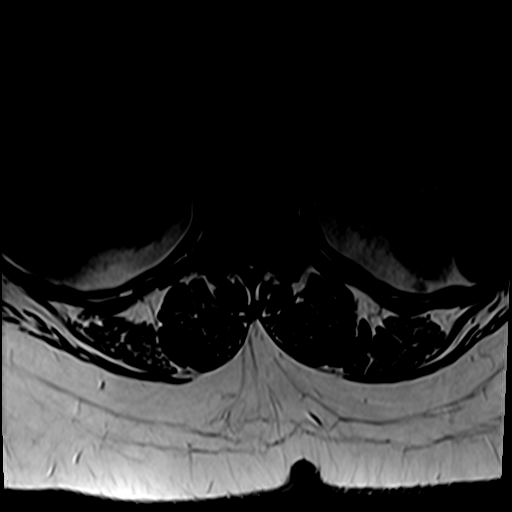
[im 36/36]
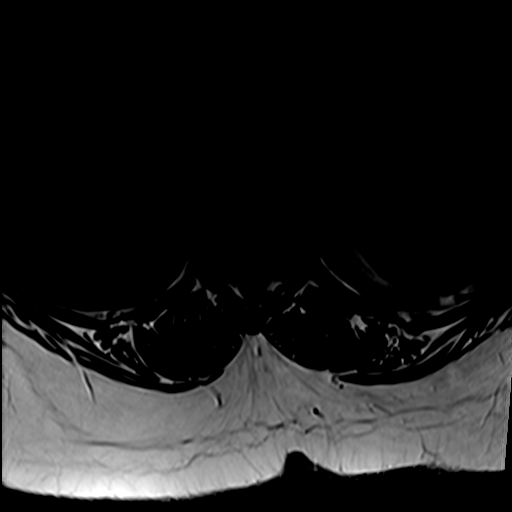

[30 of 48 positions shown; findings below may reference images not displayed]

FINDINGS: Segmentation: Transitional lumbosacral anatomy. Five lumbar type
vertebral bodies with partial lumbarization of S1. This is
consistent with the prior MRI numbering system.

Alignment: New grade 1 anterolisthesis of L4 on L5. Similar slight
(grade 1) anterolisthesis of L5 on S1.

Vertebrae: No specific evidence of acute fracture or
discitis/osteomyelitis. Vertebral body heights are maintained.

Conus medullaris and cauda equina: Conus extends to the superior L2
level. Conus appears normal.

Paraspinal and other soft tissues: Unremarkable.

Disc levels:

T12-L1: No significant disc protrusion, foraminal stenosis, or canal
stenosis.

L1-L2: Disc height loss. Similar disc herniation with caudal
migration in the right subarticular recess. Similar mild narrowing
of the right subarticular recess without apparent neural
compression. No significant foraminal or canal stenosis.

L2-L3: Similar small left subarticular disc protrusion without
significant canal or foraminal stenosis.

L3-L4: No significant disc protrusion, foraminal stenosis, or canal
stenosis.

L4-L5: Slightly increased grade 1 anterolisthesis of L4 on L5.
Similar disc height loss with slight left eccentric disc bulge.
Progressive severe bilateral facet arthropathy. No significant canal
or foraminal stenosis.

L5-S1: Similar slight (grade 1) anterolisthesis of L5 on S1. Similar
severe right and moderate left facet arthropathy. No significant
canal or foraminal stenosis.

S1-S2: Transitional anatomy. No significant canal or foraminal
stenosis.
IMPRESSION: 1. Transitional lumbosacral anatomy. Five lumbar type vertebral
bodies with partial lumbarization of S1.
2. At L4-L5, new grade 1 anterolisthesis with progressive severe
bilateral facet arthropathy. No significant canal or foraminal
stenosis.
3. At L5-S1, similar severe right and moderate left facet
arthropathy without significant stenosis.
4. Similar disc protrusions at L1-L2 and L2-L3 (detailed above)
without significant canal or foraminal stenosis.

## 2021-08-27 ENCOUNTER — Other Ambulatory Visit: Payer: Self-pay | Admitting: Surgery

## 2021-08-27 NOTE — TOC Progression Note (Signed)
Transition of Care [facility]) - Progression Note    Patient Details  Name: Kimberly Lynn MRN: [id number] Date of Birth: 1965/09/24  Transition of Care [facility]) CM/SW Riggins, RN Phone Number: 08/27/2021, 2:50 PM  Clinical Narrative:  Patient completed worksheet for DME She stated that she does not have a rolling walker or 3 in 1, [employer] is to deliver both to the home prior to DC        Expected Discharge Plan and Services                                                 Social Determinants of Health (SDOH) Interventions    Readmission Risk Interventions     No data to display

## 2021-09-03 ENCOUNTER — Other Ambulatory Visit: Payer: Self-pay

## 2021-09-03 ENCOUNTER — Encounter
Admission: RE | Admit: 2021-09-03 | Discharge: 2021-09-03 | Disposition: A | Discharge status: Home / Self Care | Payer: BC Managed Care – PPO | Source: Ambulatory Visit | Attending: Surgery | Admitting: Surgery

## 2021-09-03 VITALS — BP 121/97 | HR 113 | Resp 16 | Ht 62.0 in | Wt 253.0 lb

## 2021-09-03 DIAGNOSIS — Z01818 Encounter for other preprocedural examination: Secondary | ICD-10-CM | POA: Diagnosis present

## 2021-09-03 DIAGNOSIS — R7303 Prediabetes: Secondary | ICD-10-CM | POA: Insufficient documentation

## 2021-09-03 DIAGNOSIS — Z6841 Body Mass Index (BMI) 40.0 and over, adult: Secondary | ICD-10-CM | POA: Insufficient documentation

## 2021-09-03 DIAGNOSIS — Z01810 Encounter for preprocedural cardiovascular examination: Secondary | ICD-10-CM | POA: Diagnosis not present

## 2021-09-03 HISTORY — DX: Anxiety disorder, unspecified: F41.9

## 2021-09-03 HISTORY — DX: Angina pectoris, unspecified: I20.9

## 2021-09-03 HISTORY — DX: Acute myocardial infarction, unspecified: I21.9

## 2021-09-03 HISTORY — DX: Dyspnea, unspecified: R06.00

## 2021-09-03 HISTORY — DX: Prediabetes: R73.03

## 2021-09-03 HISTORY — DX: Fatty (change of) liver, not elsewhere classified: K76.0

## 2021-09-03 LAB — URINALYSIS, ROUTINE W REFLEX MICROSCOPIC
Bilirubin Urine: NEGATIVE
Glucose, UA: NEGATIVE mg/dL
Hgb urine dipstick: NEGATIVE
Ketones, ur: NEGATIVE mg/dL
Leukocytes,Ua: NEGATIVE
Nitrite: NEGATIVE
Protein, ur: NEGATIVE mg/dL
Specific Gravity, Urine: 1.02 (ref 1.005–1.030)
pH: 5.5 (ref 5.0–8.0)

## 2021-09-03 LAB — COMPREHENSIVE METABOLIC PANEL
ALT: 46 U/L — ABNORMAL HIGH (ref 0–44)
AST: 40 U/L (ref 15–41)
Albumin: 3.6 g/dL (ref 3.5–5.0)
Alkaline Phosphatase: 194 U/L — ABNORMAL HIGH (ref 38–126)
Anion gap: 8 (ref 5–15)
BUN: 19 mg/dL (ref 6–20)
CO2: 25 mmol/L (ref 22–32)
Calcium: 9.3 mg/dL (ref 8.9–10.3)
Chloride: 108 mmol/L (ref 98–111)
Creatinine, Ser: 0.7 mg/dL (ref 0.44–1.00)
GFR, Estimated: >60 mL/min (ref >60)
Glucose, Bld: 105 mg/dL — ABNORMAL HIGH (ref 70–99)
Potassium: 3.4 mmol/L — ABNORMAL LOW (ref 3.5–5.1)
Sodium: 141 mmol/L (ref 135–145)
Total Bilirubin: 0.5 mg/dL (ref 0.3–1.2)
Total Protein: 8.1 g/dL (ref 6.5–8.1)

## 2021-09-03 LAB — SURGICAL PCR SCREEN
MRSA, PCR: NEGATIVE
Staphylococcus aureus: POSITIVE — AB

## 2021-09-03 LAB — CBC WITH DIFFERENTIAL/PLATELET
Abs Immature Granulocytes: 0.03 10*3/uL (ref 0.00–0.07)
Basophils Absolute: 0 10*3/uL (ref 0.0–0.1)
Basophils Relative: 0 %
Eosinophils Absolute: 0 10*3/uL (ref 0.0–0.5)
Eosinophils Relative: 0 %
HCT: 41.4 % (ref 36.0–46.0)
Hemoglobin: 13.3 g/dL (ref 12.0–15.0)
Immature Granulocytes: 0 %
Lymphocytes Relative: 32 %
Lymphs Abs: 2.4 10*3/uL (ref 0.7–4.0)
MCH: 28.4 pg (ref 26.0–34.0)
MCHC: 32.1 g/dL (ref 30.0–36.0)
MCV: 88.3 fL (ref 80.0–100.0)
Monocytes Absolute: 0.5 10*3/uL (ref 0.1–1.0)
Monocytes Relative: 7 %
Neutro Abs: 4.4 10*3/uL (ref 1.7–7.7)
Neutrophils Relative %: 61 %
Platelets: 276 10*3/uL (ref 150–400)
RBC: 4.69 MIL/uL (ref 3.87–5.11)
RDW: 14.2 % (ref 11.5–15.5)
WBC: 7.3 10*3/uL (ref 4.0–10.5)
nRBC: 0 % (ref 0.0–0.2)

## 2021-09-03 LAB — TYPE AND SCREEN
ABO/RH(D): O POS
Antibody Screen: NEGATIVE

## 2021-09-03 NOTE — Patient Instructions (Signed)
Your procedure is scheduled on: 09/10/21 Report to [place]. To find out your arrival time please call [phone] between 1PM - 3PM on 09/09/21.  Remember: Instructions that are not followed completely may result in serious medical risk, up to and including death, or upon the discretion of your surgeon and anesthesiologist your surgery may need to be rescheduled.     _X__ 1. Do not eat food after midnight the night before your procedure.                 No gum chewing or hard candies. You may drink clear liquids up to 2 hours                 before you are scheduled to arrive for your surgery- DO not drink clear                 liquids within 2 hours of the start of your surgery.                 Clear Liquids include:  water, apple juice without pulp, clear carbohydrate                 drink such as Clearfast or Gatorade or G2 Gatorade, Black Coffee or Tea (Do not add                 anything to coffee or tea). Diabetics water only  __X__2.  On the morning of surgery brush your teeth with toothpaste and water, you                 may rinse your mouth with mouthwash if you wish.  Do not swallow any              toothpaste of mouthwash.     _X__ 3.  No Alcohol for 24 hours before or after surgery.   _X__ 4.  Do Not Smoke or use e-cigarettes For 24 Hours Prior to Your Surgery.                 Do not use any chewable tobacco products for at least 6 hours prior to                 surgery.  ____  5.  Bring all medications with you on the day of surgery if instructed.   __X__  6.  Notify your doctor if there is any change in your medical condition      (cold, fever, infections).     Do not wear jewelry, make-up, hairpins, clips or nail or toenail polish. Do not wear lotions, powders, or perfumes.  Do not shave body hair 48 hours prior to surgery. Men may shave face and neck. Do not bring valuables to the hospital.    [facility] is  not responsible for any belongings or valuables.  Contacts, dentures/partials or body piercings may not be worn into surgery. Bring a case for your contacts, glasses or hearing aids, a denture cup will be supplied. Leave your suitcase in the car. After surgery it may be brought to your room. For patients admitted to the hospital, discharge time is determined by your treatment team.   Patients discharged the day of surgery will not be allowed to drive home.   Please read over the following fact sheets that you were given:   MRSA Information, CHG soap, Incentive Spirometer  __X__ Take these medicines  the morning of surgery with A SIP OF WATER:    1. atenolol (TENORMIN) 50 MG tablet  2. ezetimibe (ZETIA) 10 MG tablet  3. pantoprazole (PROTONIX) 40 MG tablet  4. sertraline (ZOLOFT) 100 MG tablet  5. LORazepam (ATIVAN) 0.5 mg or 1 MG tablet if needed  6.  ____ Fleet Enema (as directed)   __X__ Use CHG Soap/SAGE wipes as directed  __X__ Use inhalers on the day of surgery  ____ Stop metformin/Janumet/Farxiga 2 days prior to surgery    ____ Take 1/2 of usual insulin dose the night before surgery. No insulin the morning          of surgery.   ____ Stop Blood Thinners Coumadin/Plavix/Xarelto/Pleta/Pradaxa/Eliquis/Effient/Aspirin  on   Or contact your Surgeon, Cardiologist or Medical Doctor regarding  ability to stop your blood thinners  __X__ Stop Anti-inflammatories 7 days before surgery such as Advil, Ibuprofen, Motrin,  BC or Goodies Powder, Naprosyn, Naproxen, Aleve   __X__ Stop all herbals and supplements, fish oil or vitamins  for 1 week until after surgery.    ____ Bring C-Pap to the hospital.    May continue aspirin except on the day of your surgery. Contact us with any medications changes or additions. [phone] ask to speak to nurse.

## 2021-09-04 ENCOUNTER — Encounter: Payer: Self-pay | Admitting: Surgery

## 2021-09-04 NOTE — Progress Notes (Signed)
Perioperative Services  Pre-Admission/Anesthesia Testing Clinical Review  Date: 09/06/21  Patient Demographics:  Name: Kimberly Lynn DOB:   1965/06/06 MRN:   [id number]  Planned Surgical Procedure(s):    Case: [id number] Date/Time: 09/10/21 0715   Procedure: TOTAL KNEE ARTHROPLASTY (Right: Knee)   Anesthesia type: Choice   Pre-op diagnosis: PRIMARY OSTEOARTHRITIS OF RIGHT KNEE.   Location: [facility] OR ROOM 03 / [place] ORS FOR ANESTHESIA GROUP   Surgeons: Corky Mull, MD   NOTE: Available PAT nursing documentation and vital signs have been reviewed. Clinical nursing staff has updated patient's PMH/PSHx, current medication list, and drug allergies/intolerances to ensure comprehensive history available to assist in medical decision making as it pertains to the aforementioned surgical procedure and anticipated anesthetic course. Extensive review of available clinical information performed. Kimberly Lynn PMH and PSHx updated with any diagnoses/procedures that  may have been inadvertently omitted during her intake with the pre-admission testing department's nursing staff.  Clinical Discussion:  Kimberly Lynn is a 56 y.o. female who is submitted for pre-surgical anesthesia review and clearance prior to her undergoing the above procedure. Patient is a Current Smoker. Pertinent PMH includes: CAD, NSTEMI, angina, diastolic dysfunction, bradycardia, HTN, HLD, prediabetes, GERD (on daily PPI), hiatal hernia, asthma, DOE, OA, lumbar DDD, depression, anxiety (on BZO).  Patient is followed by cardiology Ubaldo Glassing, MD). She was last seen in the cardiology clinic on 06/05/2021; notes reviewed.  At the time of her clinic visit, patient doing well overall from a cardiovascular perspective.  Patient denied any episodes of chest pain at rest, however she continues to experience exertional episodes.  Patient with difficulty sleeping secondary to work-related stress.  She denied any episodes of shortness of breath, PND,  orthopnea, palpitations, significant peripheral edema, vertiginous symptoms, or presyncope/syncope.  Patient with past medical history significant for cardiovascular diagnoses.  Patient suffered an NSTEMI on 12/08/2019.  She was treated at [facility].  Troponins were trended: 71 --> 72 --> 56 ng/L.  Diagnostic left heart catheterization was performed on 12/09/2019 revealing a normal left ventricular systolic function with an EF of 55%.  LVEDP mildly elevated at 70 mmHg.  There was no evidence of obstructive CAD noted during the study.  The performed on 12/09/2019 revealed a normal left ventricular systolic function with an EF of >55%.  There was hypokinesis of the basal inferior segments noted. Diastolic Doppler parameters consistent with abnormal relaxation (G1DD).  Right ventricle normal in size with normal systolic function.  There were no significant valvular abnormalities or evidence of increased transvalvular gradients to suggest stenosis.  Stress echocardiogram was performed on 11/23/2020 revealing a normal left ventricular systolic function with an EF of >55%.  RVSF normal.  Study determined to be normal overall.   Blood pressure well-controlled at 120/64 on currently prescribed beta-blocker (atenolol), diuretic (HCTZ), and ARB (losartan) therapies.  Patient is on ezetimibe for her HLD diagnosis and further ASCVD prevention.  She has a prediabetes diagnosis; last HgbA1c was well-controlled at 5.9% when checked on 06/19/2021.  Patient does not have OSAH.  No changes were made to her current medication regimen.  Patient to follow-up with outpatient cardiology in 6 months or sooner if needed.  Kimberly Lynn is scheduled for an elective RIGHT TOTAL KNEE ARTHROPLASTY on 09/10/2021 with Dr. Milagros Evener, MD. Given patient's past medical history significant for cardiovascular diagnoses, presurgical cardiac clearance was sought by the PAT team.  Per cardiology, "based on her recent cardiac work-up, she  appears to be optimized and at an overall LOW  risk for this procedure from a cardiac standpoint".  This patient is on daily antiplatelet therapy. She has been instructed on recommendations for holding her daily low dose ASA for 5 days prior to her procedure with plans to restart as soon as postoperative bleeding risk felt to be minimized by her attending surgeon. The patient has been instructed that her last dose of her anticoagulant will be on 09/04/2021.  Patient denies previous perioperative complications with anesthesia in the past. In review of the available records, it is noted that patient underwent a MAC anesthetic course here at [facility] (ASA III) in 02/2021 without documented complications.      09/03/2021    8:19 AM 01/24/2019    2:59 PM 06/20/2017   10:28 AM  Vitals with BMI  Height _0   _1   Weight 253 lbs  250 lbs  BMI 51.88  41.66  Systolic 063 016   Diastolic 97 75   Pulse 010 41     Providers/Specialists:   NOTE: Primary physician provider listed below. Patient may have been seen by APP or partner within same practice.   PROVIDER ROLE / SPECIALTY LAST OV  Poggi, Marshall Cork, MD Orthopedics (Surgeon) 08/26/2021  Sherre Scarlet, PA-C Primary Care Provider 08/21/2021  Bartholome Bill, MD Cardiology 06/05/2021   Allergies:  Neomycin  Current Home Medications:   No current facility-administered medications for this encounter.    acetaminophen (TYLENOL) 650 MG CR tablet   albuterol (VENTOLIN HFA) 108 (90 Base) MCG/ACT inhaler   aspirin EC 81 MG tablet   Aspirin-Salicylamide-Caffeine (BC HEADACHE POWDER PO)   atenolol (TENORMIN) 50 MG tablet   ezetimibe (ZETIA) 10 MG tablet   hydrochlorothiazide (HYDRODIURIL) 25 MG tablet   LORazepam (ATIVAN) 1 MG tablet   losartan (COZAAR) 50 MG tablet   nitroGLYCERIN (NITROSTAT) 0.4 MG SL tablet   pantoprazole (PROTONIX) 40 MG tablet   sertraline (ZOLOFT) 100 MG tablet   History:   Past Medical  History:  Diagnosis Date   Anginal pain (HCC)    Anxiety    a.) on BZO (lorazepam) PRN   Arthritis    Asthma    Bradycardia    DDD (degenerative disc disease), lumbar    Depression    Diastolic dysfunction 93/23/5573   a.) TTE 12/09/2019 --> performed following NSTEMI that was Dx'd on 12/08/2019: EF >55%, basal inferior HK, G1DD.   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[place])    Nephrolithiasis 2021   NSTEMI (non-ST elevated myocardial infarction) ([place]) 12/08/2019   a.) Tx'd at [facility]; b.) Troponins trended 71 --> 72 --> 56 ng/L; c.) LHC 12/09/2019: EF 55%, LVEDP 17 mmHg, no obstructive CAD   Pre-diabetes    Past Surgical History:  Procedure Laterality Date   ABDOMINAL HYSTERECTOMY     CESAREAN SECTION     COLONOSCOPY WITH PROPOFOL N/A 06/04/2015   Procedure: COLONOSCOPY WITH PROPOFOL;  Surgeon: Manya Silvas, MD;  Location: [place];  Service: Endoscopy;  Laterality: N/A;   ESOPHAGOGASTRODUODENOSCOPY (EGD) WITH PROPOFOL N/A 06/04/2015   Procedure: ESOPHAGOGASTRODUODENOSCOPY (EGD) WITH PROPOFOL;  Surgeon: Manya Silvas, MD;  Location: [facility] END[facility]COPY;  Service: Endoscopy;  Laterality: N/A;  LAPAROSCOPIC REMOVAL ABDOMINAL MASS     benign   No family history on file. Social History   Tobacco Use   Smoking status: Every Day    Packs/day: 0.25    Types: Cigarettes   Smokeless tobacco: Never  Vaping Use   Vaping Use: Never used  Substance Use Topics   Alcohol use: No   Drug use: No    Pertinent Clinical Results:  LABS: Labs reviewed: Acceptable for surgery.  Hospital Outpatient Visit on 09/03/2021  Component Date Value Ref Range Status   WBC 09/03/2021 7.3  4.0 - 10.5 K/uL Final   RBC 09/03/2021 4.69  3.87 - 5.11 MIL/uL  Final   Hemoglobin 09/03/2021 13.3  12.0 - 15.0 g/dL Final   HCT 09/03/2021 41.4  36.0 - 46.0 % Final   MCV 09/03/2021 88.3  80.0 - 100.0 fL Final   MCH 09/03/2021 28.4  26.0 - 34.0 pg Final   MCHC 09/03/2021 32.1  30.0 - 36.0 g/dL Final   RDW 09/03/2021 14.2  11.5 - 15.5 % Final   Platelets 09/03/2021 276  150 - 400 K/uL Final   nRBC 09/03/2021 0.0  0.0 - 0.2 % Final   Neutrophils Relative % 09/03/2021 61  % Final   Neutro Abs 09/03/2021 4.4  1.7 - 7.7 K/uL Final   Lymphocytes Relative 09/03/2021 32  % Final   Lymphs Abs 09/03/2021 2.4  0.7 - 4.0 K/uL Final   Monocytes Relative 09/03/2021 7  % Final   Monocytes Absolute 09/03/2021 0.5  0.1 - 1.0 K/uL Final   Eosinophils Relative 09/03/2021 0  % Final   Eosinophils Absolute 09/03/2021 0.0  0.0 - 0.5 K/uL Final   Basophils Relative 09/03/2021 0  % Final   Basophils Absolute 09/03/2021 0.0  0.0 - 0.1 K/uL Final   Immature Granulocytes 09/03/2021 0  % Final   Abs Immature Granulocytes 09/03/2021 0.03  0.00 - 0.07 K/uL Final   Performed at [facility], [place], [place] [address]   Sodium 09/03/2021 141  135 - 145 mmol/L Final   Potassium 09/03/2021 3.4 (L)  3.5 - 5.1 mmol/L Final   Chloride 09/03/2021 108  98 - 111 mmol/L Final   CO2 09/03/2021 25  22 - 32 mmol/L Final   Glucose, Bld 09/03/2021 105 (H)  70 - 99 mg/dL Final   Glucose reference range applies only to samples taken after fasting for at least 8 hours.   BUN 09/03/2021 19  6 - 20 mg/dL Final   Creatinine, Ser 09/03/2021 0.70  0.44 - 1.00 mg/dL Final   Calcium 09/03/2021 9.3  8.9 - 10.3 mg/dL Final   Total Protein 09/03/2021 8.1  6.5 - 8.1 g/dL Final   Albumin 09/03/2021 3.6  3.5 - 5.0 g/dL Final   AST 09/03/2021 40  15 - 41 U/L Final   ALT 09/03/2021 46 (H)  0 - 44 U/L Final   Alkaline Phosphatase 09/03/2021 194 (H)  38 - 126 U/L Final   Total Bilirubin 09/03/2021 0.5  0.3 - 1.2 mg/dL Final   GFR, Estimated 09/03/2021 >60  >60 mL/min Final    Comment: (NOTE) Calculated using the CKD-EPI Creatinine Equation (2021)    Anion gap 09/03/2021 8  5 - 15 Final   Performed at [facility], [place], [place] [address]   Color, Urine 09/03/2021 YELLOW  YELLOW Final   APPearance 09/03/2021 CLEAR  CLEAR Final   Specific Gravity, Urine 09/03/2021 1.020  1.005 -  1.030 Final   pH 09/03/2021 5.5  5.0 - 8.0 Final   Glucose, UA 09/03/2021 NEGATIVE  NEGATIVE mg/dL Final   Hgb urine dipstick 09/03/2021 NEGATIVE  NEGATIVE Final   Bilirubin Urine 09/03/2021 NEGATIVE  NEGATIVE Final   Ketones, ur 09/03/2021 NEGATIVE  NEGATIVE mg/dL Final   Protein, ur 09/03/2021 NEGATIVE  NEGATIVE mg/dL Final   Nitrite 09/03/2021 NEGATIVE  NEGATIVE Final   Leukocytes,Ua 09/03/2021 NEGATIVE  NEGATIVE Final   Comment: Microscopic not done on urines with negative protein, blood, leukocytes, nitrite, or glucose < 500 mg/dL. Performed at [facility], [address] [place], [place] [address]    ABO/RH(D) 09/03/2021 O POS   Final   Antibody Screen 09/03/2021 NEG   Final   Sample Expiration 09/03/2021 09/17/2021,2359   Final   Extend sample reason 09/03/2021    Final                   Value:NO TRANSFUSIONS OR PREGNANCY IN THE PAST 3 MONTHS Performed at [facility], [address] [place], [place] [address]    MRSA, PCR 09/03/2021 NEGATIVE  NEGATIVE Final   Staphylococcus aureus 09/03/2021 POSITIVE (A)  NEGATIVE Final   Comment: (NOTE) The Xpert SA Assay (FDA approved for NASAL specimens in patients 9 years of age and older), is one component of a comprehensive surveillance program. It is not intended to diagnose infection nor to guide or monitor treatment. Performed at [facility], [address] [place],  [address]     ECG: Date: 09/03/2021 Time ECG obtained: 0948 AM Rate: 99 bpm Rhythm: normal sinus Axis (leads I and aVF): Normal Intervals: PR 164 ms. QRS 88 ms. QTc 492 ms. ST segment  and T wave changes: Nonspecific T wave abnormality; prolonged QTc  Comparison: Similar to previous tracing obtained on 12/10/2019   IMAGING / PROCEDURES: DIAGNOSTIC RADIOGRAPHS OF KNEE RIGHT 4 PLUS VIEWS performed on 05/30/2021 Right knee with medial compartment complete collapse with bone-on-bone pathology and medial femoral osteophyte formation.   There is no translation of the tibia on the femur.   The left knee reveals medial compartment collapse on the PA flexed view.   The patellofemoral joint shows mild bilateral alignment.    ECHO STRESS TEST performed on 11/23/2020 Normal left ventricular systolic function with an a resting and post-rest EF of >55% Normal right ventricular systolic function No significant valvular regurgitation Normal gradients; no valvular stenosis  LEFT HEART CATHETERIZATION AND CORONARY ANGIOGRAPHY performed on 12/09/2019 Normal left ventricular systolic function with an EF of >55% Mildly elevated left ventricular filling pressures; LVEDP = 17 mmHg No angiographically evident coronary artery disease Recommendations: risk factor modification  Impression and Plan:  Kimberly Lynn has been referred for pre-anesthesia review and clearance prior to her undergoing the planned anesthetic and procedural courses. Available labs, pertinent testing, and imaging results were personally reviewed by me. This patient has been appropriately cleared by cardiology with an overall LOW risk of significant perioperative cardiovascular complications.  Based on clinical review performed today (09/06/21), barring any significant acute changes in the patient's overall condition, it is anticipated that she will be able to proceed with the planned surgical intervention. Any acute changes in clinical condition may necessitate her procedure being postponed and/or cancelled. Patient will meet with anesthesia team (MD and/or CRNA) on the day of her procedure for preoperative  evaluation/assessment. Questions regarding anesthetic course will be fielded at that time.   Pre-surgical instructions were reviewed with the patient during her  PAT appointment and questions were fielded by PAT clinical staff. Patient was advised that if any questions or concerns arise prior to her procedure then she should return a call to PAT and/or her surgeon's office to discuss.  Honor Loh, MSN, APRN, FNP-C, CEN [facility]  Peri-operative Services Nurse Practitioner Phone: [phone] Fax: [phone] 09/06/21 7:36 AM  NOTE: This note has been prepared using Dragon dictation software. Despite my best ability to proofread, there is always the potential that unintentional transcriptional errors may still occur from this process.

## 2021-09-05 DIAGNOSIS — Z96651 Presence of right artificial knee joint: Secondary | ICD-10-CM | POA: Insufficient documentation

## 2021-09-06 ENCOUNTER — Encounter: Payer: Self-pay | Admitting: Surgery

## 2021-09-09 MED ORDER — ORAL CARE MOUTH RINSE: 15.0000 mL | Freq: Once | OROMUCOSAL | Status: AC

## 2021-09-09 MED ORDER — CEFAZOLIN SODIUM-DEXTROSE 2-4 GM/100ML-% IV SOLN
2.0000 g | INTRAVENOUS | Status: AC
  Administered 2021-09-10: 2 g via INTRAVENOUS

## 2021-09-09 MED ORDER — LACTATED RINGERS IV SOLN: INTRAVENOUS | Status: DC

## 2021-09-09 MED ORDER — CHLORHEXIDINE GLUCONATE 0.12 % MT SOLN: 15.0000 mL | Freq: Once | OROMUCOSAL | Status: AC

## 2021-09-10 ENCOUNTER — Ambulatory Visit: Payer: BC Managed Care – PPO | Admitting: Urgent Care

## 2021-09-10 ENCOUNTER — Ambulatory Visit: Payer: BC Managed Care – PPO

## 2021-09-10 ENCOUNTER — Ambulatory Visit
Admission: RE | Admit: 2021-09-10 | Discharge: 2021-09-10 | Disposition: A | Discharge status: Home / Self Care | Payer: BC Managed Care – PPO | Attending: Surgery | Admitting: Surgery

## 2021-09-10 ENCOUNTER — Other Ambulatory Visit: Payer: Self-pay

## 2021-09-10 ENCOUNTER — Encounter: Payer: Self-pay | Admitting: Surgery

## 2021-09-10 ENCOUNTER — Encounter
Admission: RE | Disposition: A | Discharge status: Home / Self Care | Payer: Self-pay | Source: Home / Self Care | Attending: Surgery

## 2021-09-10 DIAGNOSIS — F17200 Nicotine dependence, unspecified, uncomplicated: Secondary | ICD-10-CM | POA: Diagnosis not present

## 2021-09-10 DIAGNOSIS — K219 Gastro-esophageal reflux disease without esophagitis: Secondary | ICD-10-CM | POA: Insufficient documentation

## 2021-09-10 DIAGNOSIS — I252 Old myocardial infarction: Secondary | ICD-10-CM | POA: Diagnosis not present

## 2021-09-10 DIAGNOSIS — J45909 Unspecified asthma, uncomplicated: Secondary | ICD-10-CM | POA: Insufficient documentation

## 2021-09-10 DIAGNOSIS — Z6841 Body Mass Index (BMI) 40.0 and over, adult: Secondary | ICD-10-CM | POA: Insufficient documentation

## 2021-09-10 DIAGNOSIS — R7303 Prediabetes: Secondary | ICD-10-CM

## 2021-09-10 DIAGNOSIS — F418 Other specified anxiety disorders: Secondary | ICD-10-CM | POA: Insufficient documentation

## 2021-09-10 DIAGNOSIS — I10 Essential (primary) hypertension: Secondary | ICD-10-CM | POA: Diagnosis not present

## 2021-09-10 DIAGNOSIS — E785 Hyperlipidemia, unspecified: Secondary | ICD-10-CM | POA: Diagnosis not present

## 2021-09-10 DIAGNOSIS — M1711 Unilateral primary osteoarthritis, right knee: Secondary | ICD-10-CM | POA: Diagnosis present

## 2021-09-10 HISTORY — DX: Other intervertebral disc degeneration, lumbar region: M51.36

## 2021-09-10 HISTORY — DX: Hyperlipidemia, unspecified: E78.5

## 2021-09-10 HISTORY — DX: Other intervertebral disc degeneration, lumbar region without mention of lumbar back pain or lower extremity pain: M51.369

## 2021-09-10 HISTORY — DX: Personal history of other specified conditions: Z87.898

## 2021-09-10 HISTORY — DX: Other specified abnormal findings of blood chemistry: R79.89

## 2021-09-10 HISTORY — DX: Fatty (change of) liver, not elsewhere classified: K76.0

## 2021-09-10 HISTORY — DX: Diaphragmatic hernia without obstruction or gangrene: K44.9

## 2021-09-10 HISTORY — DX: Gout, unspecified: M10.9

## 2021-09-10 HISTORY — DX: Residual hemorrhoidal skin tags: K64.4

## 2021-09-10 HISTORY — DX: Unspecified asthma, uncomplicated: J45.909

## 2021-09-10 HISTORY — DX: Other forms of dyspnea: R06.09

## 2021-09-10 HISTORY — PX: TOTAL KNEE ARTHROPLASTY: SHX125

## 2021-09-10 HISTORY — DX: Bradycardia, unspecified: R00.1

## 2021-09-10 HISTORY — DX: Malignant neoplasm of right ovary: C56.1

## 2021-09-10 LAB — ABO/RH: ABO/RH(D): O POS

## 2021-09-10 SURGERY — ARTHROPLASTY, KNEE, TOTAL
Anesthesia: Spinal | Site: Knee | Laterality: Right

## 2021-09-10 MED ORDER — PROPOFOL 1000 MG/100ML IV EMUL
INTRAVENOUS | Status: AC
  Filled 2021-09-10: qty 100

## 2021-09-10 MED ORDER — BUPIVACAINE LIPOSOME 1.3 % IJ SUSP
INTRAMUSCULAR | Status: AC
  Filled 2021-09-10: qty 20

## 2021-09-10 MED ORDER — TRIAMCINOLONE ACETONIDE 40 MG/ML IJ SUSP
INTRAMUSCULAR | Status: DC | PRN
  Administered 2021-09-10: 91.5 mL

## 2021-09-10 MED ORDER — ONDANSETRON HCL 4 MG/2ML IJ SOLN: 4.0000 mg | Freq: Once | INTRAMUSCULAR | Status: DC | PRN

## 2021-09-10 MED ORDER — STERILE WATER FOR IRRIGATION IR SOLN
Status: DC | PRN
  Administered 2021-09-10: 1000 mL

## 2021-09-10 MED ORDER — ACETAMINOPHEN 10 MG/ML IV SOLN
INTRAVENOUS | Status: DC | PRN
  Administered 2021-09-10: 1000 mg via INTRAVENOUS

## 2021-09-10 MED ORDER — SODIUM CHLORIDE 0.9 % IV SOLN: INTRAVENOUS | Status: DC

## 2021-09-10 MED ORDER — SODIUM CHLORIDE 0.9 % IV SOLN
250.0000 mL | Freq: Once | INTRAVENOUS | Status: AC
  Administered 2021-09-10: 250 mL via INTRAVENOUS

## 2021-09-10 MED ORDER — OXYCODONE HCL 5 MG/5ML PO SOLN: 5.0000 mg | Freq: Once | ORAL | Status: DC | PRN

## 2021-09-10 MED ORDER — PHENYLEPHRINE HCL-NACL 20-0.9 MG/250ML-% IV SOLN
INTRAVENOUS | Status: AC
  Filled 2021-09-10: qty 250

## 2021-09-10 MED ORDER — VASOPRESSIN 20 UNIT/ML IV SOLN
INTRAVENOUS | Status: DC | PRN
  Administered 2021-09-10: 1 [IU] via INTRAVENOUS
  Administered 2021-09-10: .5 [IU] via INTRAVENOUS

## 2021-09-10 MED ORDER — LIDOCAINE HCL (PF) 2 % IJ SOLN
INTRAMUSCULAR | Status: AC
  Filled 2021-09-10: qty 5

## 2021-09-10 MED ORDER — KETOROLAC TROMETHAMINE 30 MG/ML IJ SOLN: 30.0000 mg | Freq: Once | INTRAMUSCULAR | Status: AC

## 2021-09-10 MED ORDER — KETOROLAC TROMETHAMINE 15 MG/ML IJ SOLN: 15.0000 mg | Freq: Four times a day (QID) | INTRAMUSCULAR | Status: DC

## 2021-09-10 MED ORDER — SODIUM CHLORIDE 0.9 % IR SOLN
Status: DC | PRN
  Administered 2021-09-10: 3000 mL

## 2021-09-10 MED ORDER — ONDANSETRON HCL 4 MG/2ML IJ SOLN
INTRAMUSCULAR | Status: AC
  Filled 2021-09-10: qty 2

## 2021-09-10 MED ORDER — ONDANSETRON HCL 4 MG/2ML IJ SOLN: 4.0000 mg | Freq: Four times a day (QID) | INTRAMUSCULAR | Status: DC | PRN

## 2021-09-10 MED ORDER — MIDAZOLAM HCL 2 MG/2ML IJ SOLN
INTRAMUSCULAR | Status: AC
  Filled 2021-09-10: qty 2

## 2021-09-10 MED ORDER — FENTANYL CITRATE (PF) 100 MCG/2ML IJ SOLN: 25.0000 ug | INTRAMUSCULAR | Status: DC | PRN

## 2021-09-10 MED ORDER — MIDAZOLAM HCL 5 MG/5ML IJ SOLN
INTRAMUSCULAR | Status: DC | PRN
  Administered 2021-09-10 (×2): 1 mg via INTRAVENOUS

## 2021-09-10 MED ORDER — TRANEXAMIC ACID 1000 MG/10ML IV SOLN
INTRAVENOUS | Status: AC
  Filled 2021-09-10: qty 10

## 2021-09-10 MED ORDER — METOCLOPRAMIDE HCL 5 MG/ML IJ SOLN: 5.0000 mg | Freq: Three times a day (TID) | INTRAMUSCULAR | Status: DC | PRN

## 2021-09-10 MED ORDER — EPHEDRINE SULFATE (PRESSORS) 50 MG/ML IJ SOLN
INTRAMUSCULAR | Status: DC | PRN
  Administered 2021-09-10 (×5): 5 mg via INTRAVENOUS

## 2021-09-10 MED ORDER — OXYCODONE HCL 5 MG PO TABS: 5.0000 mg | ORAL_TABLET | ORAL | 0 refills | Status: DC | PRN

## 2021-09-10 MED ORDER — KETOROLAC TROMETHAMINE 30 MG/ML IJ SOLN
INTRAMUSCULAR | Status: AC
  Administered 2021-09-10: 30 mg via INTRAVENOUS
  Filled 2021-09-10: qty 1

## 2021-09-10 MED ORDER — CEFAZOLIN SODIUM-DEXTROSE 2-4 GM/100ML-% IV SOLN: 2.0000 g | Freq: Four times a day (QID) | INTRAVENOUS | Status: DC

## 2021-09-10 MED ORDER — BUPIVACAINE HCL (PF) 0.5 % IJ SOLN
INTRAMUSCULAR | Status: DC | PRN
  Administered 2021-09-10: 2.6 mL via INTRATHECAL

## 2021-09-10 MED ORDER — ACETAMINOPHEN 10 MG/ML IV SOLN
INTRAVENOUS | Status: AC
  Filled 2021-09-10: qty 100

## 2021-09-10 MED ORDER — FENTANYL CITRATE (PF) 100 MCG/2ML IJ SOLN
INTRAMUSCULAR | Status: AC
  Filled 2021-09-10: qty 2

## 2021-09-10 MED ORDER — ONDANSETRON HCL 4 MG/2ML IJ SOLN
INTRAMUSCULAR | Status: DC | PRN
  Administered 2021-09-10: 4 mg via INTRAVENOUS

## 2021-09-10 MED ORDER — TRIAMCINOLONE ACETONIDE 40 MG/ML IJ SUSP
INTRAMUSCULAR | Status: AC
  Filled 2021-09-10: qty 1

## 2021-09-10 MED ORDER — PROPOFOL 10 MG/ML IV BOLUS
INTRAVENOUS | Status: AC
  Filled 2021-09-10: qty 20

## 2021-09-10 MED ORDER — ACETAMINOPHEN 500 MG PO TABS: 1000.0000 mg | ORAL_TABLET | Freq: Four times a day (QID) | ORAL | Status: DC

## 2021-09-10 MED ORDER — PROPOFOL 10 MG/ML IV BOLUS
INTRAVENOUS | Status: DC | PRN
  Administered 2021-09-10: 10 mg via INTRAVENOUS

## 2021-09-10 MED ORDER — BUPIVACAINE-EPINEPHRINE (PF) 0.5% -1:200000 IJ SOLN
INTRAMUSCULAR | Status: AC
  Filled 2021-09-10: qty 30

## 2021-09-10 MED ORDER — FENTANYL CITRATE (PF) 100 MCG/2ML IJ SOLN
INTRAMUSCULAR | Status: DC | PRN
  Administered 2021-09-10: 50 ug via INTRAVENOUS

## 2021-09-10 MED ORDER — APIXABAN 2.5 MG PO TABS: 2.5000 mg | ORAL_TABLET | Freq: Two times a day (BID) | ORAL | 0 refills | Status: DC

## 2021-09-10 MED ORDER — PHENYLEPHRINE 80 MCG/ML (10ML) SYRINGE FOR IV PUSH (FOR BLOOD PRESSURE SUPPORT)
PREFILLED_SYRINGE | INTRAVENOUS | Status: DC | PRN
  Administered 2021-09-10: 200 ug via INTRAVENOUS
  Administered 2021-09-10: 160 ug via INTRAVENOUS
  Administered 2021-09-10 (×2): 80 ug via INTRAVENOUS

## 2021-09-10 MED ORDER — PROPOFOL 500 MG/50ML IV EMUL
INTRAVENOUS | Status: DC | PRN
  Administered 2021-09-10: 75 ug/kg/min via INTRAVENOUS

## 2021-09-10 MED ORDER — OXYCODONE HCL 5 MG PO TABS: 5.0000 mg | ORAL_TABLET | Freq: Once | ORAL | Status: DC | PRN

## 2021-09-10 MED ORDER — ONDANSETRON HCL 4 MG PO TABS: 4.0000 mg | ORAL_TABLET | Freq: Four times a day (QID) | ORAL | Status: DC | PRN

## 2021-09-10 MED ORDER — CEFAZOLIN SODIUM-DEXTROSE 2-4 GM/100ML-% IV SOLN
INTRAVENOUS | Status: AC
  Filled 2021-09-10: qty 100

## 2021-09-10 MED ORDER — ACETAMINOPHEN 10 MG/ML IV SOLN: 1000.0000 mg | Freq: Once | INTRAVENOUS | Status: DC | PRN

## 2021-09-10 MED ORDER — TRANEXAMIC ACID 1000 MG/10ML IV SOLN
INTRAVENOUS | Status: DC | PRN
  Administered 2021-09-10: 1000 mg via TOPICAL

## 2021-09-10 MED ORDER — METOCLOPRAMIDE HCL 10 MG PO TABS: 5.0000 mg | ORAL_TABLET | Freq: Three times a day (TID) | ORAL | Status: DC | PRN

## 2021-09-10 MED ORDER — PHENYLEPHRINE HCL-NACL 20-0.9 MG/250ML-% IV SOLN
INTRAVENOUS | Status: DC | PRN
  Administered 2021-09-10: 35 ug/min via INTRAVENOUS

## 2021-09-10 MED ORDER — CHLORHEXIDINE GLUCONATE 0.12 % MT SOLN
OROMUCOSAL | Status: AC
  Administered 2021-09-10: 15 mL via OROMUCOSAL
  Filled 2021-09-10: qty 15

## 2021-09-10 MED ORDER — 0.9 % SODIUM CHLORIDE (POUR BTL) OPTIME
TOPICAL | Status: DC | PRN
  Administered 2021-09-10: 1000 mL

## 2021-09-10 MED ORDER — GLYCOPYRROLATE 0.2 MG/ML IJ SOLN
INTRAMUSCULAR | Status: DC | PRN
  Administered 2021-09-10: .2 mg via INTRAVENOUS

## 2021-09-10 MED ORDER — LIDOCAINE HCL (CARDIAC) PF 100 MG/5ML IV SOSY
PREFILLED_SYRINGE | INTRAVENOUS | Status: DC | PRN
  Administered 2021-09-10: 50 mg via INTRAVENOUS

## 2021-09-10 MED ORDER — OXYCODONE HCL 5 MG PO TABS: 5.0000 mg | ORAL_TABLET | ORAL | Status: DC | PRN

## 2021-09-10 SURGICAL SUPPLY — 61 items
BIT DRILL QUICK REL 1/8 2PK SL (DRILL) IMPLANT
BLADE SAW SAG 25X90X1.19 (BLADE) ×2 IMPLANT
BLADE SURG SZ20 CARB STEEL (BLADE) ×2 IMPLANT
BNDG ELASTIC 6X5.8 VLCR NS LF (GAUZE/BANDAGES/DRESSINGS) ×2 IMPLANT
CEMENT BONE R 1X40 (Cement) ×4 IMPLANT
CEMENT VACUUM MIXING SYSTEM (MISCELLANEOUS) ×2 IMPLANT
CHLORAPREP W/TINT 26 (MISCELLANEOUS) ×2 IMPLANT
COMP PAT 3 PEG SERIES A 31/6.2 (Miscellaneous) ×2 IMPLANT
COMPONENT PAT3 PEG SERS 31/6.2 (Miscellaneous) IMPLANT
COOLER POLAR GLACIER W/PUMP (MISCELLANEOUS) ×2 IMPLANT
COVER MAYO STAND REUSABLE (DRAPES) ×2 IMPLANT
CUFF TOURN SGL QUICK 24 (TOURNIQUET CUFF)
CUFF TOURN SGL QUICK 34 (TOURNIQUET CUFF)
CUFF TRNQT CYL 24X4X16.5-23 (TOURNIQUET CUFF) IMPLANT
CUFF TRNQT CYL 34X4.125X (TOURNIQUET CUFF) IMPLANT
DRAPE 3/4 80X56 (DRAPES) ×2 IMPLANT
DRAPE IMP U-DRAPE 54X76 (DRAPES) ×2 IMPLANT
DRAPE U-SHAPE 47X51 STRL (DRAPES) ×2 IMPLANT
DRILL QUICK RELEASE 1/8 INCH (DRILL) ×3
DRSG MEPILEX SACRM 8.7X9.8 (GAUZE/BANDAGES/DRESSINGS) IMPLANT
DRSG OPSITE POSTOP 4X10 (GAUZE/BANDAGES/DRESSINGS) ×2 IMPLANT
DRSG OPSITE POSTOP 4X8 (GAUZE/BANDAGES/DRESSINGS) ×2 IMPLANT
ELECT REM PT RETURN 9FT ADLT (ELECTROSURGICAL) ×2
ELECTRODE REM PT RTRN 9FT ADLT (ELECTROSURGICAL) ×1 IMPLANT
GAUZE XEROFORM 1X8 LF (GAUZE/BANDAGES/DRESSINGS) ×2 IMPLANT
GLOVE BIO SURGEON STRL SZ7.5 (GLOVE) ×8 IMPLANT
GLOVE BIO SURGEON STRL SZ8 (GLOVE) ×8 IMPLANT
GLOVE BIOGEL PI IND STRL 8 (GLOVE) ×1 IMPLANT
GLOVE BIOGEL PI INDICATOR 8 (GLOVE) ×1
GLOVE SURG UNDER LTX SZ8 (GLOVE) ×2 IMPLANT
GOWN STRL REUS W/ TWL LRG LVL3 (GOWN DISPOSABLE) ×1 IMPLANT
GOWN STRL REUS W/ TWL XL LVL3 (GOWN DISPOSABLE) ×1 IMPLANT
GOWN STRL REUS W/TWL LRG LVL3 (GOWN DISPOSABLE) ×1
GOWN STRL REUS W/TWL XL LVL3 (GOWN DISPOSABLE) ×1
HOOD PEEL AWAY FLYTE STAYCOOL (MISCELLANEOUS) ×6 IMPLANT
INSERT TIB BEARING 71X12 (Insert) ×1 IMPLANT
IV NS IRRIG 3000ML ARTHROMATIC (IV SOLUTION) ×2 IMPLANT
KIT TURNOVER KIT A (KITS) ×2 IMPLANT
KNEE CR FEMORAL RT 65MM (Femur) ×1 IMPLANT
MANIFOLD NEPTUNE II (INSTRUMENTS) ×2 IMPLANT
NDL SPNL 20GX3.5 QUINCKE YW (NEEDLE) ×1 IMPLANT
NEEDLE SPNL 20GX3.5 QUINCKE YW (NEEDLE) ×2 IMPLANT
NS IRRIG 1000ML POUR BTL (IV SOLUTION) ×2 IMPLANT
PACK TOTAL KNEE (MISCELLANEOUS) ×2 IMPLANT
PAD WRAPON POLAR KNEE (MISCELLANEOUS) ×1 IMPLANT
PENCIL SMOKE EVACUATOR (MISCELLANEOUS) ×2 IMPLANT
PLATE KNEE TIBIAL 71MM FIXED (Plate) ×1 IMPLANT
PULSAVAC PLUS IRRIG FAN TIP (DISPOSABLE) ×2
STAPLER SKIN PROX 35W (STAPLE) ×2 IMPLANT
SUCTION FRAZIER HANDLE 10FR (MISCELLANEOUS) ×1
SUCTION TUBE FRAZIER 10FR DISP (MISCELLANEOUS) ×1 IMPLANT
SUT VIC AB 0 CT1 36 (SUTURE) ×6 IMPLANT
SUT VIC AB 2-0 CT1 27 (SUTURE) ×3
SUT VIC AB 2-0 CT1 TAPERPNT 27 (SUTURE) ×3 IMPLANT
SYR 10ML LL (SYRINGE) ×2 IMPLANT
SYR 20ML LL LF (SYRINGE) ×2 IMPLANT
SYR 30ML LL (SYRINGE) IMPLANT
TIP FAN IRRIG PULSAVAC PLUS (DISPOSABLE) ×1 IMPLANT
TRAP FLUID SMOKE EVACUATOR (MISCELLANEOUS) ×4 IMPLANT
WATER STERILE IRR 500ML POUR (IV SOLUTION) ×2 IMPLANT
WRAPON POLAR PAD KNEE (MISCELLANEOUS) ×2

## 2021-09-10 NOTE — Op Note (Signed)
09/10/2021  9:51 AM  Patient:   Kimberly Lynn  Pre-Op Diagnosis:   Degenerative joint disease, right knee.  Post-Op Diagnosis:   Same  Procedure:   Right TKA using all-cemented Bi[employer] Vanguard system with a 65 mm PCR femur, a 71 mm tibial tray with a 12 mm anterior stabilized E-poly insert, and a 31 x 6.2 mm all-poly 3-pegged domed patella.  Surgeon:   Pascal Lux, MD  Assistant:   Cameron Proud, PA-C; Almon Register, PA-S  Anesthesia:   Spinal  Findings:   As above  Complications:   None  EBL:   5 cc  Fluids:   1000 cc crystalloid  UOP:   None  TT:   90 minutes at 300 mmHg  Drains:   None  Closure:   Staples  Implants:   As above  Brief Clinical Note:   The patient is a 56 year old female with a long history of progressively worsening right knee pain. The patient's symptoms have progressed despite medications, activity modification, injections, etc. The patient's history and examination were consistent with advanced degenerative joint disease of the right knee confirmed by plain radiographs. The patient presents at this time for a right total knee arthroplasty.  Procedure:   The patient was brought into the operating room. After adequate spinal anesthesia was obtained, the patient was lain in the supine position before the right lower extremity was prepped with ChloraPrep solution and draped sterilely. Preoperative antibiotics were administered. A timeout was performed to verify the appropriate surgical site before the limb was exsanguinated with an Esmarch and the tourniquet inflated to 300 mmHg.   A standard anterior approach to the knee was made through an approximately 7 inch incision. The incision was carried down through the subcutaneous tissues to expose superficial retinaculum. This was split the length of the incision and the medial flap elevated sufficiently to expose the medial retinaculum. The medial retinaculum was incised, leaving a 3-4 mm cuff of tissue on the  patella. This was extended distally along the medial border of the patellar tendon and proximally through the medial third of the quadriceps tendon. A subtotal fat pad excision was performed before the soft tissues were elevated off the anteromedial and anterolateral aspects of the proximal tibia to the level of the collateral ligaments. The anterior portions of the medial and lateral menisci were removed, as was the anterior cruciate ligament. With the knee flexed to 90, the external tibial guide was positioned and the appropriate proximal tibial cut made. This piece was taken to the back table where it was measured and found to be optimally replicated by a 71 mm component.  Attention was directed to the distal femur. The intramedullary canal was accessed through a 3/8" drill hole. The intramedullary guide was inserted and positioned in order to obtain a neutral flexion gap. The intercondylar block was positioned with care taken to avoid notching the anterior cortex of the femur. The appropriate cut was made. Next, the distal cutting block was placed at 5 of valgus alignment. Using the 9 mm slot, the distal cut was made. The distal femur was measured and found to be optimally replicated by the 65 mm component. The 65 mm 4-in-1 cutting block was positioned and first the posterior, then the posterior chamfer, the anterior chamfer, and finally the anterior cuts were made. At this point, the posterior portions medial and lateral menisci were removed. A trial reduction was performed using the appropriate femoral and tibial components with first the 10 mm  and then the 12 mm insert. The 12 mm insert demonstrated excellent stability to varus and valgus stressing both in flexion and extension while permitting full extension. Patella tracking was assessed and found to be excellent. Therefore, the tibial guide position was marked on the proximal tibia. The patella thickness was measured and found to be 24 mm. Therefore,  the appropriate cut was made. The patellar surface was measured and found to be optimally replicated by the 31 mm component. The three peg holes were drilled in place before the trial button was inserted. Patella tracking was assessed and found to be excellent, passing the "no thumb test". The lug holes were drilled into the distal femur before the trial component was removed, leaving only the tibial tray. The keel was then created using the appropriate tower, reamer, and punch.  The bony surfaces were prepared for cementing by irrigating them thoroughly with sterile saline solution via the jet lavage system. A bone plug was fashioned from some of the bone that had been removed previously and used to plug the distal femoral canal. In addition, 20 cc of Exparel diluted out to 60 cc with normal saline and 30 cc of 0.5% Sensorcaine were injected into the postero-medial and postero-lateral aspects of the knee, the medial and lateral gutter regions, and the peri-incisional tissues to help with postoperative analgesia. Meanwhile, the cement was being mixed on the back table. When it was ready, the tibial tray was cemented in first. The excess cement was removed using [employer] fast streamer. Next, the femoral component was impacted into place. Again, the excess cement was removed using [employer] fast streamer. The 12 mm trial insert was positioned and the knee brought into extension while the cement hardened. Finally, the patella was cemented into place and secured using the patellar clamp. Again, the excess cement was removed using [employer] fast streamer. Once the cement had hardened, the knee was placed through a range of motion with the findings as described above. Therefore, the trial insert was removed and, after verifying that no cement had been retained posteriorly, the permanent 12 mm anterior stabilized E-polyethylene insert was positioned and secured using the appropriate key locking mechanism. Again the knee was placed through a  range of motion with the findings as described above.  The wound was copiously irrigated with sterile saline solution using the jet lavage system before the quadriceps tendon and retinacular layer were reapproximated using #0 Vicryl interrupted sutures. The superficial retinacular layer also was closed using a running #0 Vicryl suture. A total of 10 cc of transexemic acid (TXA) was injected intra-articularly before the subcutaneous tissues were closed in several layers using 2-0 Vicryl interrupted sutures. The skin was closed using staples. A sterile honeycomb dressing was applied to the skin before the leg was wrapped with an Ace wrap to accommodate the Polar Care device. The patient was then awakened and returned to the recovery room in satisfactory condition after tolerating the procedure well.

## 2021-09-10 NOTE — Transfer of Care (Signed)
Immediate Anesthesia Transfer of Care Note  Patient: Kimberly Lynn  Procedure(s) Performed: TOTAL KNEE ARTHROPLASTY (Right: Knee)  Patient Location: PACU  Anesthesia Type:Spinal  Level of Consciousness: awake  Airway & Oxygen Therapy: Patient connected to face mask oxygen  Post-op Assessment: Report given to RN and Post -op Vital signs reviewed and stable  Post vital signs: Reviewed and stable  Last Vitals:  Vitals Value Taken Time  BP 103/62 09/10/21 1002  Temp    Pulse 74 09/10/21 1006  Resp 17 09/10/21 1006  SpO2 100 % 09/10/21 1006  Vitals shown include unvalidated device data.  Last Pain:  Vitals:   09/10/21 0621  TempSrc: Temporal  PainSc: 4          Complications: No notable events documented.

## 2021-09-10 NOTE — Anesthesia Preprocedure Evaluation (Signed)
Anesthesia Evaluation  Patient identified by MRN, date of birth, ID band Patient awake    Reviewed: Allergy & Precautions, NPO status , Patient's Chart, lab work & pertinent test results  History of Anesthesia Complications Negative for: history of anesthetic complications  Airway Mallampati: II  TM Distance: >3 FB Neck ROM: Full    Dental no notable dental hx. (+) Teeth Intact   Pulmonary asthma , neg sleep apnea, neg COPD, Current Smoker and Patient abstained from smoking.,  Inhaler use couple times per month; never hospitalized for asthma   Pulmonary exam normal breath sounds clear to auscultation       Cardiovascular Exercise Tolerance: Poor METShypertension, + Past MI and + DOE  (-) CAD (-) dysrhythmias  Rhythm:Regular Rate:Normal - Systolic murmurs ? Patient suffered an NSTEMI on 12/08/2019.  She was treated at [facility].  Troponins were trended: 71 --> 72 --> 56 ng/L.  Diagnostic left heart catheterization was performed on 12/09/2019 revealing a normal left ventricular systolic function with an EF of 55%.  LVEDP mildly elevated at 70 mmHg.  There was no evidence of obstructive CAD noted during the study.  ? The performed on 12/09/2019 revealed a normal left ventricular systolic function with an EF of >55%.  There was hypokinesis of the basal inferior segments noted. Diastolic Doppler parameters consistent with abnormal relaxation (G1DD).  Right ventricle normal in size with normal systolic function.  There were no significant valvular abnormalities or evidence of increased transvalvular gradients to suggest stenosis.  ? Stress echocardiogram was performed on 11/23/2020 revealing a normal left ventricular systolic function with an EF of >55%.  RVSF normal.  Study determined to be normal overall.    Neuro/Psych  Headaches, PSYCHIATRIC DISORDERS Anxiety Depression    GI/Hepatic hiatal hernia, GERD  Medicated and  Controlled,(+)     (-) substance abuse  ,   Endo/Other  neg diabetesMorbid obesity  Renal/GU negative Renal ROS     Musculoskeletal  (+) Arthritis , Osteoarthritis,    Abdominal (+) + obese,   Peds  Hematology   Anesthesia Other Findings Past Medical History: No date: Anginal pain ([place]) No date: Anxiety     Comment:  a.) on BZO (lorazepam) PRN No date: Arthritis No date: Asthma No date: Bradycardia No date: DDD (degenerative disc disease), lumbar No date: Depression 37/62/8315: Diastolic dysfunction     Comment:  a.) TTE 12/09/2019 --> performed following NSTEMI that               was Dx'd on 12/08/2019: EF >55%, basal inferior HK, G1DD. 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[place]) 2021: Nephrolithiasis 12/08/2019: NSTEMI (non-ST elevated myocardial infarction) ([place])     Comment:  a.) Tx'd at [facility]; b.) Troponins trended 71               --> 72 --> 56 ng/L; c.) LHC 12/09/2019: EF 55%, LVEDP 17               mmHg, no obstructive CAD No date: Pre-diabetes  Reproductive/Obstetrics                             Anesthesia Physical Anesthesia Plan  ASA: 3  Anesthesia Plan: Spinal   Post-op Pain Management: Ofirmev IV (intra-op)*   Induction: Intravenous  PONV Risk Score and Plan: 1 and Ondansetron, Dexamethasone, Propofol infusion, TIVA and Midazolam  Airway Management Planned: Natural Airway  Additional Equipment: None  Intra-op Plan:   Post-operative Plan:   Informed Consent: I have reviewed the patients History and Physical, chart, labs and discussed the procedure including the risks, benefits and alternatives for the  proposed anesthesia with the patient or authorized representative who has indicated his/her understanding and acceptance.       Plan Discussed with: CRNA and Surgeon  Anesthesia Plan Comments: (Discussed R/B/A of neuraxial anesthesia technique with patient: - rare risks of spinal/epidural hematoma, nerve damage, infection - Risk of PDPH - Risk of nausea and vomiting - Risk of conversion to general anesthesia and its associated risks, including sore throat, damage to lips/eyes/teeth/oropharynx, and rare risks such as cardiac and respiratory events. - Risk of allergic reactions  Patient informed about increased incidence of above perioperative risk due to high BMI. Patient understands.  Discussed the role of CRNA in patient's perioperative care.  Patient voiced understanding.)        Anesthesia Quick Evaluation

## 2021-09-10 NOTE — Anesthesia Procedure Notes (Signed)
Spinal  Patient location during procedure: OR Reason for block: surgical anesthesia Staffing Performed: anesthesiologist and resident/CRNA  Anesthesiologist: Arita Miss, MD Resident/CRNA: Loletha Grayer, CRNA Performed by: Arita Miss, MD Authorized by: Arita Miss, MD   Preanesthetic Checklist Completed: patient identified, IV checked, site marked, risks and benefits discussed, surgical consent, monitors and equipment checked, pre-op evaluation and timeout performed Spinal Block Patient position: sitting Prep: ChloraPrep and site prepped and draped Patient monitoring: heart rate, continuous pulse ox, blood pressure and cardiac monitor Approach: midline Location: L3-4 Injection technique: single-shot Needle Needle type: Quincke  Needle gauge: 22 G Needle length: 9 cm Assessment Sensory level: T10 Events: CSF return Additional Notes Attempts made by CRNA, followed by successful attempt by MD at new level. Meticulous sterile technique used throughout (CHG prep, sterile gloves, sterile drape). Negative paresthesia. Negative blood return. Positive free-flowing CSF. Expiration date of kit checked and confirmed. Patient tolerated procedure well, without complications.

## 2021-09-10 NOTE — H&P (Signed)
History of Present Illness: Kimberly Lynn is a 56 y.o.female who is being referred by Reche Dixon, PA-C, for right knee pain. The symptoms began many years ago and developed without any specific cause or injury. She recalls getting her for steroid injection in approximately 2010 and has received numerous injections over the years. However, over the past several years, her symptoms have worsened and these injections have become less effective, prompting her to seek further treatment. She saw Reche Dixon, PA-C, about 3 months ago complaining of worsening right knee pain. X-rays at that time demonstrated significant degenerative changes, primarily involving the medial compartment. Therefore, the patient was referred to me for further evaluation and treatment. She reports 6/10 pain in the right knee on today's visit. The pain is located along the medial aspect of the knee. The pain is described as aching, dull, stabbing, and throbbing. The symptoms are aggravated constantly, with normal daily activities, with sleeping, using stairs, at higher levels of activity, walking, and standing. She also describes no mechanical symptoms. She has associated swelling and deformity. She has tried acetaminophen, over-the-counter medications, anti-inflammatories, steroid injections, a home exercise program, ice, and heat with temporary partial relief of her symptoms. She denies any recent reinjury to the knee, but does have a history of back pain as well as intermittent numbness and paresthesias to her legs.  Current Outpatient Medications: albuterol 90 mcg/actuation inhaler Inhale 2 inhalations into the lungs every 6 (six) hours as needed for Wheezing 1 each 1  aspirin 81 MG EC tablet Take 1 tablet (81 mg total) by mouth once daily 30 tablet 11  atenoloL (TENORMIN) 50 MG tablet Take 1 tablet (50 mg total) by mouth once daily 90 tablet 3  ezetimibe (ZETIA) 10 mg tablet Take 1 tablet (10 mg total) by mouth once daily 90 tablet 3   hydroCHLOROthiazide (HYDRODIURIL) 12.5 MG tablet Take 1 tablet (12.5 mg total) by mouth once daily 90 tablet 3  LORazepam (ATIVAN) 1 MG tablet Take 1 mg by mouth at bedtime as needed  losartan (COZAAR) 50 MG tablet Take 1 tablet (50 mg total) by mouth once daily 90 tablet 3  pantoprazole (PROTONIX) 40 MG DR tablet Take 1 tablet (40 mg total) by mouth once daily 90 tablet 3  psyllium husk, with sugar, (METAMUCIL) 3.4 gram/12 gram oral powder Take 3.4 g by mouth once daily Mix with a full glass (240 mL) of fluid.  sertraline (ZOLOFT) 100 MG tablet Take 100 mg by mouth once daily   Allergies:  Neomycin Acetate Swelling  Ear drops. Face swelling  Neomycin-Polymyxin-Hc Swelling   Past Medical History:  Acute depression 03/23/2015  Acute myocardial infarction of anterolateral wall, initial episode of care (CMS-HCC) 2021  Asthma, unspecified asthma severity, unspecified whether complicated, unspecified whether persistent  GERD (gastroesophageal reflux disease)  History of kidney stones 2021  Last episode 2021, spontaneously passed, no recurrence  History of snoring  Hyperlipidemia  Hypertension  Obesity  Pelvic mass 08/26/2016  Oncology History: - 08/15/16: Evaluated in f/u for GERD and chest pain-- recent EGD with evidence of hiatal hernia and gastritis. Due to elevated LFTs (despite discontinuation of statins) a Liver U/S was ordered. RUQ U/S: Large cystic structure in the abdomen/pelvis, measuring up to 22 cm. (Notably, RUQ U/S on 05/11/15 at [facility] did not note abdominal mass; unclear if it panned down to the abd  Wears glasses   Past Surgical History:  [place] 2007  TLH, BS(?); no oophorectomy; due to AUB  COLONOSCOPY 07/17/2009  Adenomatous Polyp, FH Colon Polyps (Father): [facility] 06/2014; Recall Ltr mailed 05/03/2014 (dw)  COLONOSCOPY 06/04/2015  PH Adenomatous Polyp, FH Colon Polyps (Father): [facility] 05/2020  EGD 06/04/2015  No repeat per RTE  LAPAROSCOPIC  SALPINGO-OOPHORECTOMY N/A 09/23/2016  Procedure: LAPAROSCOPY, SURGICAL; WITH REMOVAL OF ADNEXAL STRUCTURES (PARTIAL/TOTAL Unilateral OOPHORECTOMY AND Bilateral SALPINGECTOMY); Surgeon: Caroline More, MD; Location: [place]; Service: Gynecology; Laterality: N/A;  COLONOSCOPY N/A 03/08/2021  Procedure: COLORECTAL CANCER SCREENING; COLONOSCOPY ON INDIVIDUAL AT HIGH RISK; Surgeon: Ellison Hughs, MD; Location: [facility]; Service: Gastroenterology; Laterality: N/A;  HEMORRHOIDECTOMY EXTERNAL   Family History:  High blood pressure (Hypertension) Father  Seizures Sister  Anesthesia problems Neg Hx  Malignant hypertension Neg Hx  Malignant hyperthermia Neg Hx  Pseudochol deficiency Neg Hx  Coronary Artery Disease (Blocked arteries around heart) Neg Hx  Myocardial Infarction (Heart attack) Neg Hx  Sudden death (unexpected death due to unknown cause) Neg Hx   Social History:   Socioeconomic History:  Marital status: Married  Spouse name: Marjory Lies Schone  Number of children: 1  Occupational History  Occupation: stay at home  Tobacco Use  Smoking status: Some Days  Packs/day: 0.25  Years: 10.00  Pack years: 2.50  Types: Cigarettes  Last attempt to quit: 08/27/2016  Years since quitting: 5.0  Smokeless tobacco: Never  Tobacco comments:  5 cigarettes per week  Vaping Use  Vaping Use: Never used  Substance and Sexual Activity  Alcohol use: Not Currently  Drug use: No  Sexual activity: Yes  Partners: Male  Social History Narrative  arried, lives with husband and daughter, born 35. One year of college. Works in Programmer, applications at [employer]. No exercise.   Social Determinants of Health   Financial Resource Strain: High Risk  Difficulty of Paying Living Expenses: Very hard  Food Insecurity: Food Insecurity Present  Worried About Charity fundraiser in the Last Year: Often true  Arboriculturist in the Last Year: Often true  Transportation Needs: No Transportation  Needs  Lack of Transportation (Medical): No  Lack of Transportation (Non-Medical): No   Review of Systems:  A comprehensive 14 point ROS was performed, reviewed, and the pertinent orthopaedic findings are documented in the HPI.  Physical Exam: Vitals:  08/26/21 1139  BP: 116/82  Weight: (!) 117.3 kg (258 lb 9.6 oz)  Height: 157.5 cm ('5\' 2"'$ )  PainSc: 6  PainLoc: Knee   General/Constitutional: Pleasant significantly overweight middle-aged female in no acute distress. Neuro/Psych: Normal mood and affect, oriented to person, place and time. Eyes: Non-icteric. Pupils are equal, round, and reactive to light, and exhibit synchronous movement. Lymphatic: No palpable adenopathy. Respiratory: Lungs clear to auscultation, Normal chest excursion, No wheezes, and Non-labored breathing Cardiovascular: Regular rate and rhythm. No murmurs. and No edema, swelling or tenderness, except as noted in detailed exam. Vascular: No edema, swelling or tenderness, except as noted in detailed exam. Integumentary: No impressive skin lesions present, except as noted in detailed exam. Musculoskeletal: Unremarkable, except as noted in detailed exam.  Right knee exam: GAIT: mild limp and uses no assistive devices. ALIGNMENT: moderate varus SKIN: unremarkable SWELLING: mild EFFUSION: small WARMTH: no warmth TENDERNESS: moderate tenderness over the medial joint line, minimal tenderness along lateral joint line ROM: 0 to 115 degrees with mild pain in maximal flexion McMURRAY'S: equivocal PATELLOFEMORAL: normal tracking with no peri-patellar tenderness and negative apprehension sign CREPITUS: no LACHMAN'S: negative PIVOT SHIFT: negative ANTERIOR DRAWER: negative POSTERIOR DRAWER: negative VARUS/VALGUS: positive pseudolaxity to  varus stressing  She is neurovascularly intact to the right lower extremity and foot.  Knee Imaging: Recent AP weightbearing of both knees, as well as lateral and merchant views of  the right knee are available for review and have been reviewed by myself. These films demonstrate severe degenerative changes, primarily involving the medial compartment with 90-100% Medial joint space narrowing. Overall alignment is moderate varus. No fractures, lytic lesions, or abnormal calcifications are noted.  Assessment:  1. Primary osteoarthritis of right knee.   Plan: The treatment options were discussed with the patient. In addition, patient educational materials were provided regarding the diagnosis and treatment options. The patient is quite frustrated by her symptoms and functional limitations, and is ready to consider more aggressive treatment options. Therefore, I have recommended a surgical procedure, specifically a right total knee arthroplasty. The procedure was discussed with the patient, as were the potential risks (including bleeding, infection, nerve and/or blood vessel injury, persistent or recurrent pain, loosening and/or failure of the components, dislocation, need for further surgery, blood clots, strokes, heart attacks and/or arhythmias, pneumonia, etc.) and benefits. The patient states his/her understanding and wishes to proceed. All of the patient's questions and concerns were answered. She can call any time with further concerns. She will follow up post-surgery, routine.  H&P reviewed and patient re-examined. No changes.

## 2021-09-10 NOTE — Evaluation (Signed)
Physical Therapy Evaluation Patient Details Name: Kimberly Lynn MRN: [id number] DOB: 1965-02-01 Today's Date: 09/10/2021  History of Present Illness  Patient is a 56 year old female with degenerative joint disease of right knee. S/p right TKA. History of hypertension, NSTEMI, asthma, current smoker  Clinical Impression  Patient is agreeable to PT evaluation. Supportive family at the bedside. The patient reports she was independent with mobility with occasional use of a cane at baseline.  The patient was able to get OOB and ambulate with a rolling walker in the hallway with occasional cues for gait pattern and safety. She performed stair training without difficulty. Exercises initiated and patient provided with home exercise program. Recommend to continue PT to maximize independence and facilitate return to prior level of function. HHPT recommended at discharge.      Recommendations for follow up therapy are one component of a multi-disciplinary discharge planning process, led by the attending physician.  Recommendations may be updated based on patient status, additional functional criteria and insurance authorization.  Follow Up Recommendations Home health PT      Assistance Recommended at Discharge Set up Supervision/Assistance  Patient can return home with the following  Help with stairs or ramp for entrance;Assist for transportation;A little help with walking and/or transfers;A little help with bathing/dressing/bathroom    Equipment Recommendations None recommended by PT  Recommendations for Other Services       Functional Status Assessment Patient has had a recent decline in their functional status and demonstrates the ability to make significant improvements in function in a reasonable and predictable amount of time.     Precautions / Restrictions Precautions Precautions: Knee;Fall Precaution Booklet Issued: Yes (comment) Restrictions Weight Bearing Restrictions: Yes RLE Weight  Bearing: Weight bearing as tolerated      Mobility  Bed Mobility Overal bed mobility: Modified Independent                  Transfers Overall transfer level: Modified independent                      Ambulation/Gait Ambulation/Gait assistance: Min guard, Supervision Gait Distance (Feet): 140 Feet Assistive device: Rolling walker (2 wheels) Gait Pattern/deviations: Step-to pattern, Decreased stance time - right, Decreased stride length, Antalgic Gait velocity: decreased     General Gait Details: verbal cues for rolling walker and BLE sequencing. Min guard initially progressing to supervision with increased ambulation distance. cues to place rolling walker closer to base of support and to avoid trunl flexion while ambulating.  Stairs Stairs: Yes Stairs assistance: Min guard Stair Management: Two rails, Step to pattern, Forwards Number of Stairs: 4 General stair comments: patient went up and down 4 steps with correct technique after initial verbal cues for technique.  Wheelchair Mobility    Modified Rankin (Stroke Patients Only)       Balance Overall balance assessment: Needs assistance Sitting-balance support: Feet supported Sitting balance-Leahy Scale: Good     Standing balance support: Bilateral upper extremity supported Standing balance-Leahy Scale: Fair Standing balance comment: with RW for UE support in standing                             Pertinent Vitals/Pain Pain Assessment Pain Assessment: 0-10 Pain Score: 3  Pain Location: R knee Pain Descriptors / Indicators: Discomfort Pain Intervention(s): Limited activity within patient's tolerance, Monitored during session, Ice applied (polar care re-applied)    Home Living Family/patient expects to be  discharged to:: Private residence Living Arrangements: Spouse/significant other Available Help at Discharge: Family Type of Home: House Home Access: Stairs to enter Entrance  Stairs-Rails: Right (going up) Entrance Stairs-Number of Steps: 8   Home Layout: One level Home Equipment: Conservation officer, nature (2 wheels);BSC/3in1;Cane - single point      Prior Function Prior Level of Function : Independent/Modified Independent             Mobility Comments: occasional use of cane ADLs Comments: independent     Hand Dominance        Extremity/Trunk Assessment   Upper Extremity Assessment Upper Extremity Assessment: Overall WFL for tasks assessed    Lower Extremity Assessment Lower Extremity Assessment: RLE deficits/detail;LLE deficits/detail RLE Deficits / Details: patient able to SLR independently. weight bearing with no knee buckling. patient able to activate hip/knee/ankle movement in gravity eliminated position RLE Sensation: decreased light touch (top of the foot) LLE Deficits / Details: WFL       Communication   Communication: No difficulties  Cognition Arousal/Alertness: Awake/alert Behavior During Therapy: WFL for tasks assessed/performed Overall Cognitive Status: Within Functional Limits for tasks assessed                                 General Comments: patient is able to follow single step commands with increased time        General Comments General comments (skin integrity, edema, etc.): patient educated on positioning of RLE to promote knee extension and to avoid having a pillow behind the knee    Exercises Total Joint Exercises Ankle Circles/Pumps: AROM, Strengthening, Both, 10 reps, Supine Straight Leg Raises: AROM, Strengthening, 10 reps, Right, Supine Goniometric ROM: R knee flexion 75 degrees measured in sitting position Other Exercises Other Exercises: written HEP provided   Assessment/Plan    PT Assessment Patient needs continued PT services  PT Problem List Decreased strength;Decreased range of motion;Decreased activity tolerance;Decreased balance;Decreased mobility;Pain;Decreased knowledge of  precautions;Decreased safety awareness       PT Treatment Interventions DME instruction;Gait training;Functional mobility training;Stair training;Therapeutic activities;Therapeutic exercise;Balance training;Neuromuscular re-education;Cognitive remediation    PT Goals (Current goals can be found in the Care Plan section)  Acute Rehab PT Goals Patient Stated Goal: to go home today PT Goal Formulation: With patient Time For Goal Achievement: 09/24/21 Potential to Achieve Goals: Good    Frequency BID     Co-evaluation               AM-PAC PT "6 Clicks" Mobility  Outcome Measure Help needed turning from your back to your side while in a flat bed without using bedrails?: A Little Help needed moving from lying on your back to sitting on the side of a flat bed without using bedrails?: A Little Help needed moving to and from a bed to a chair (including a wheelchair)?: A Little Help needed standing up from a chair using your arms (e.g., wheelchair or bedside chair)?: A Little Help needed to walk in hospital room?: A Little Help needed climbing 3-5 steps with a railing? : A Little 6 Click Score: 18    End of Session Equipment Utilized During Treatment: Gait belt Activity Tolerance: Patient tolerated treatment well Patient left: in chair;with call bell/phone within reach;with family/visitor present (polar care re-applied R knee) Nurse Communication: Mobility status PT Visit Diagnosis: Other abnormalities of gait and mobility (R26.89);Pain Pain - Right/Left: Right Pain - part of body: Knee    Time:  1240-1330 PT Time Calculation (min) (ACUTE ONLY): 50 min   Charges:   PT Evaluation $PT Eval Low Complexity: 1 Low PT Treatments $Gait Training: 8-22 mins $Therapeutic Exercise: 8-22 mins        Minna Merritts, PT, MPT   Percell Locus 09/10/2021, 2:37 PM

## 2021-09-10 NOTE — Discharge Instructions (Addendum)
Orthopedic discharge instructions: May shower with intact OpSite dressing. Apply ice frequently to knee or use Polar Care. Start Eliquis 1 tablet (2.5 mg) twice daily for 2 weeks on Wednesday, 09/11/2021, then take aspirin 325 mg twice daily for 4 weeks. Take oxycodone as prescribed when needed.  May supplement with ES Tylenol if necessary. May weight-bear as tolerated on right leg - use walker for balance and support. Follow-up in 10-14 days or as scheduled.  AMBULATORY SURGERY  DISCHARGE INSTRUCTIONS   The drugs that you were given will stay in your system until tomorrow so for the next 24 hours you should not:  Drive an automobile Make any legal decisions Drink any alcoholic beverage   You may resume regular meals tomorrow.  Today it is better to start with liquids and gradually work up to solid foods.  You may eat anything you prefer, but it is better to start with liquids, then soup and crackers, and gradually work up to solid foods.   Please notify your doctor immediately if you have any unusual bleeding, trouble breathing, redness and pain at the surgery site, drainage, fever, or pain not relieved by medication.    Additional Instructions: LEAVE GREEN ARMBAND ON FOR 4 DAYS        Please contact your physician with any problems or Same Day Surgery at [phone], Monday through Friday 6 am to 4 pm, or [place] at [facility] number at [phone].

## 2021-09-11 NOTE — Anesthesia Postprocedure Evaluation (Signed)
Anesthesia Post Note  Patient: Kimberly Lynn  Procedure(s) Performed: TOTAL KNEE ARTHROPLASTY (Right: Knee)  Anesthesia Type: Spinal Anesthetic complications: no Comments: Patient discharged before seen today 09/11/21   No notable events documented.   Last Vitals:  Vitals:   09/10/21 1221 09/10/21 1411  BP: 119/67 131/79  Pulse: 93 60  Resp: 18 17  Temp:  36.8 C  SpO2: 100% 99%    Last Pain:  Vitals:   09/10/21 1411  TempSrc: Temporal  PainSc: 0-No pain                 [name],  [name]

## 2021-11-26 ENCOUNTER — Ambulatory Visit
Admission: EM | Admit: 2021-11-26 | Discharge: 2021-11-26 | Disposition: A | Discharge status: Home / Self Care | Payer: BC Managed Care – PPO | Attending: Emergency Medicine | Admitting: Emergency Medicine

## 2021-11-26 DIAGNOSIS — R110 Nausea: Secondary | ICD-10-CM | POA: Insufficient documentation

## 2021-11-26 DIAGNOSIS — R059 Cough, unspecified: Secondary | ICD-10-CM | POA: Insufficient documentation

## 2021-11-26 DIAGNOSIS — H1031 Unspecified acute conjunctivitis, right eye: Secondary | ICD-10-CM | POA: Diagnosis not present

## 2021-11-26 DIAGNOSIS — J069 Acute upper respiratory infection, unspecified: Secondary | ICD-10-CM

## 2021-11-26 DIAGNOSIS — R197 Diarrhea, unspecified: Secondary | ICD-10-CM | POA: Diagnosis not present

## 2021-11-26 DIAGNOSIS — Z1152 Encounter for screening for COVID-19: Secondary | ICD-10-CM | POA: Diagnosis not present

## 2021-11-26 DIAGNOSIS — J029 Acute pharyngitis, unspecified: Secondary | ICD-10-CM | POA: Diagnosis not present

## 2021-11-26 LAB — SARS CORONAVIRUS 2 BY RT PCR: SARS Coronavirus 2 by RT PCR: NEGATIVE

## 2021-11-26 LAB — RAPID INFLUENZA A&B ANTIGENS
Influenza A (ARMC): NEGATIVE
Influenza B (ARMC): NEGATIVE

## 2021-11-26 MED ORDER — PROMETHAZINE-DM 6.25-15 MG/5ML PO SYRP: 5.0000 mL | ORAL_SOLUTION | Freq: Four times a day (QID) | ORAL | 0 refills | Status: DC | PRN

## 2021-11-26 MED ORDER — IPRATROPIUM BROMIDE 0.06 % NA SOLN: 2.0000 | Freq: Four times a day (QID) | NASAL | 12 refills | Status: DC

## 2021-11-26 MED ORDER — BENZONATATE 100 MG PO CAPS: 200.0000 mg | ORAL_CAPSULE | Freq: Three times a day (TID) | ORAL | 0 refills | Status: DC

## 2021-11-26 MED ORDER — MOXIFLOXACIN HCL 0.5 % OP SOLN: 1.0000 [drp] | Freq: Three times a day (TID) | OPHTHALMIC | 0 refills | Status: AC

## 2021-11-26 NOTE — ED Provider Notes (Signed)
[facility]    CSN: [id number] Arrival date & time: 11/26/21  1304      History   Chief Complaint Chief Complaint  Patient presents with   Sore Throat   Nasal Congestion   Eye Problem   Nausea   Diarrhea    HPI Kimberly Lynn is a 56 y.o. female.   HPI  37-year female here for evaluation of ocular, respiratory, and GI complaints.  Patient reports that her symptoms began with redness, drainage, blurry vision, and light sensitivity in her right eye 4 days ago.  She describes the drainage as being a greenish mucousy drainage.  She denies any itching but she states that the eye is painful.  2 days ago she developed runny nose, nasal congestion, ear pain, nonproductive cough, nausea, vomiting, diarrhea, body aches, and headache.  She denies any shortness of breath or wheezing.  She states that her granddaughter has similar symptoms.  She has taken a home COVID test that was negative.  Patient reports that today she has been able to drink fluids and eat food without any vomiting.  Past Medical History:  Diagnosis Date   Anginal pain ([place])    Anxiety    a.) on BZO (lorazepam) PRN   Arthritis    Asthma    Bradycardia    DDD (degenerative disc disease), lumbar    Depression    Diastolic dysfunction 65/99/3570   a.) TTE 12/09/2019 --> performed following NSTEMI that was Dx'd on 12/08/2019: EF >55%, basal inferior HK, G1DD.   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[place])    Nephrolithiasis 2021   NSTEMI (non-ST elevated myocardial infarction) ([place]) 12/08/2019   a.) Tx'd at [facility]; b.) Troponins trended 71 --> 72 --> 56 ng/L; c.) LHC 12/09/2019: EF 55%, LVEDP 17 mmHg, no obstructive CAD   Pre-diabetes     Patient Active Problem  List   Diagnosis Date Noted   Status post total knee replacement using cement, right 09/05/2021   Anxiety 02/10/2020   Chronic gout involving toe of right foot without tophus 02/10/2020   Bradycardia 12/09/2019   NSTEMI (non-ST elevated myocardial infarction) ([place]) 12/09/2019   Tobacco use 12/09/2019   Degenerative disc disease, lumbar 11/02/2018   Prediabetes 10/15/2018   Hyperlipidemia, unspecified 03/26/2017   Gastro-esophageal reflux disease without esophagitis 03/13/2017   Mucinous cystadenoma of right ovary 10/16/2016   Dyspnea on exertion 09/15/2016   Pelvic mass 08/26/2016   Hepatic steatosis 01/28/2016   Morbid obesity with BMI of 45.0-49.9, adult ([place]) 09/25/2015   Acute depression 03/23/2015   Essential hypertension 07/27/2014   Arthritis of knee, degenerative 07/26/2012   Elevated LFTs 06/19/2011    Past Surgical History:  Procedure Laterality Date   ABDOMINAL HYSTERECTOMY     CESAREAN SECTION     COLONOSCOPY WITH PROPOFOL N/A 06/04/2015   Procedure: COLONOSCOPY WITH PROPOFOL;  Surgeon: Manya Silvas, MD;  Location: [place];  Service: Endoscopy;  Laterality: N/A;   ESOPHAGOGASTRODUODENOSCOPY (EGD) WITH PROPOFOL N/A 06/04/2015   Procedure: ESOPHAGOGASTRODUODENOSCOPY (EGD) WITH PROPOFOL;  Surgeon: Manya Silvas, MD;  Location: [facility] ENDOSCOPY;  Service: Endoscopy;  Laterality: N/A;   LAPAROSCOPIC REMOVAL ABDOMINAL MASS  benign   TOTAL KNEE ARTHROPLASTY Right 09/10/2021   Procedure: TOTAL KNEE ARTHROPLASTY;  Surgeon: Corky Mull, MD;  Location: [facility];  Service: Orthopedics;  Laterality: Right;    OB History   No obstetric history on file.      Home Medications    Prior to Admission medications   Medication Sig Start Date End Date Taking? Authorizing Provider  benzonatate (TESSALON) 100 MG capsule Take 2 capsules (200 mg total) by mouth every 8 (eight) hours. 11/26/21  Yes Margarette Canada, NP  ipratropium (ATROVENT) 0.06 % nasal spray Place 2  sprays into both nostrils 4 (four) times daily. 11/26/21  Yes Margarette Canada, NP  moxifloxacin (VIGAMOX) 0.5 % ophthalmic solution Place 1 drop into both eyes 3 (three) times daily for 7 days. 11/26/21 12/03/21 Yes Margarette Canada, NP  promethazine-dextromethorphan (PROMETHAZINE-DM) 6.25-15 MG/5ML syrup Take 5 mLs by mouth 4 (four) times daily as needed. 11/26/21  Yes Margarette Canada, NP  acetaminophen (TYLENOL) 650 MG CR tablet Take 650 mg by mouth every 8 (eight) hours as needed for pain.    [provider]  albuterol (VENTOLIN HFA) 108 (90 Base) MCG/ACT inhaler Inhale 2 puffs into the lungs every 6 (six) hours as needed for wheezing or shortness of breath.    [provider]  apixaban (ELIQUIS) 2.5 MG TABS tablet Take 1 tablet (2.5 mg total) by mouth 2 (two) times daily. 09/11/21   Poggi, Marshall Cork, MD  Aspirin-Salicylamide-Caffeine (BC HEADACHE POWDER PO) Take 1 packet by mouth as needed.    [provider]  atenolol (TENORMIN) 50 MG tablet Take by mouth. 08/06/16 09/03/21  [provider]  ezetimibe (ZETIA) 10 MG tablet Take 10 mg by mouth daily.    [provider]  hydrochlorothiazide (HYDRODIURIL) 25 MG tablet Take 12.5 mg by mouth daily. 08/06/16 09/10/21  [provider]  LORazepam (ATIVAN) 1 MG tablet Take 0.5-1 mg by mouth daily as needed for anxiety.    [provider]  losartan (COZAAR) 50 MG tablet Take 50 mg by mouth daily.    [provider]  nitroGLYCERIN (NITROSTAT) 0.4 MG SL tablet Place under the tongue. 08/06/16   [provider]  oxyCODONE (ROXICODONE) 5 MG immediate release tablet Take 1-2 tablets (5-10 mg total) by mouth every 4 (four) hours as needed for moderate pain or severe pain. 09/10/21   Poggi, Marshall Cork, MD  pantoprazole (PROTONIX) 40 MG tablet Take 1 tablet (40 mg total) by mouth daily. 08/06/16 09/10/21  Harvest Dark, MD  sertraline (ZOLOFT) 100 MG tablet Take 100 mg by mouth daily.    [provider]    Family History History reviewed. No pertinent family history.  Social History Social History   Tobacco Use   Smoking status: Every Day    Packs/day: 0.25    Types: Cigarettes   Smokeless tobacco: Never  Vaping Use   Vaping Use: Never used  Substance Use Topics   Alcohol use: No   Drug use: No     Allergies   Neomycin and Neomycin-polymyxin-hc   Review of Systems Review of Systems  Constitutional:  Negative for fever.  HENT:  Positive for congestion, ear pain, rhinorrhea and sore throat.   Eyes:  Positive for photophobia, pain, discharge, redness and visual disturbance. Negative for itching.  Respiratory:  Positive for cough. Negative for shortness of breath and wheezing.   Gastrointestinal:  Positive for diarrhea, nausea and vomiting. Negative for abdominal pain.  Musculoskeletal:  Positive for arthralgias  and myalgias.  Skin:  Negative for rash.  Neurological:  Positive for headaches.  Hematological: Negative.   Psychiatric/Behavioral: Negative.       Physical Exam Triage Vital Signs ED Triage Vitals  Enc Vitals Group     BP 11/26/21 1357 123/76     Pulse Rate 11/26/21 1357 96     Resp 11/26/21 1357 18     Temp 11/26/21 1357 98 F (36.7 C)     Temp Source 11/26/21 1357 Oral     SpO2 11/26/21 1357 98 %     Weight --      Height --      Head Circumference --      Peak Flow --      Pain Score 11/26/21 1356 9     Pain Loc --      Pain Edu? --      Excl. in [place]? --    No data found.  Updated Vital Signs BP 123/76 (BP Location: Right Arm)   Pulse 96   Temp 98 F (36.7 C) (Oral)   Resp 18   SpO2 98%   Visual Acuity Right Eye Distance:   Left Eye Distance:   Bilateral Distance:    Right Eye Near:   Left Eye Near:    Bilateral Near:     Physical Exam Vitals and nursing note reviewed.  Constitutional:      Appearance: Normal appearance. She is not ill-appearing.  HENT:     Head: Normocephalic and atraumatic.     Right Ear:  Tympanic membrane, ear canal and external ear normal. There is no impacted cerumen.     Left Ear: Tympanic membrane and ear canal normal. There is no impacted cerumen.     Nose: Congestion and rhinorrhea present.     Comments: Nasal mucosa is erythematous edematous with clear discharge in both nares.    Mouth/Throat:     Mouth: Mucous membranes are moist.     Pharynx: Oropharynx is clear. Posterior oropharyngeal erythema present. No oropharyngeal exudate.     Comments: Posterior oropharynx reveals erythema and injection with clear postnasal drip. Eyes:     General: No scleral icterus.       Right eye: Discharge present.     Extraocular Movements: Extraocular movements intact.     Pupils: Pupils are equal, round, and reactive to light.     Comments: Bulbar and labral conjunctiva of the right eye are erythematous and injected.  There is yellow mucopurulent discharge crusted in the lashes and in the inner and outer canthus of the right eye.  Her pupils equal round reactive and her EOM is intact.  Normal red light reflex in the right eye.  Left eye is unremarkable.  Cardiovascular:     Rate and Rhythm: Normal rate and regular rhythm.     Pulses: Normal pulses.     Heart sounds: Normal heart sounds. No murmur heard.    No friction rub. No gallop.  Pulmonary:     Effort: Pulmonary effort is normal.     Breath sounds: Normal breath sounds. No wheezing, rhonchi or rales.  Musculoskeletal:     Cervical back: Normal range of motion and neck supple.  Lymphadenopathy:     Cervical: No cervical adenopathy.  Skin:    General: Skin is warm and dry.     Capillary Refill: Capillary refill takes less than 2 seconds.     Findings: No erythema or rash.  Neurological:     General: No  focal deficit present.     Mental Status: She is alert and oriented to person, place, and time.  Psychiatric:        Mood and Affect: Mood normal.        Behavior: Behavior normal.        Thought Content: Thought content  normal.        Judgment: Judgment normal.      UC Treatments / Results  Labs (all labs ordered are listed, but only abnormal results are displayed) Labs Reviewed  SARS CORONAVIRUS 2 BY RT PCR  RAPID INFLUENZA A&B ANTIGENS    EKG   Radiology No results found.  Procedures Procedures (including critical care time)  Medications Ordered in UC Medications - No data to display  Initial Impression / Assessment and Plan / UC Course  I have reviewed the triage vital signs and the nursing notes.  Pertinent labs & imaging results that were available during my care of the patient were reviewed by me and considered in my medical decision making (see chart for details).   Patient is a very pleasant, nontoxic-appearing 56 year old female here for evaluation of ocular, respiratory, GI complaints outlined HPI above.  Her exam very clearly demonstrates the presence of conjunctivitis in the right eye.  I am concerned that it is bacterial given the mucopurulence of discharge and the degree of erythema and injection present.  I will treat her with Vigamox 3 times daily x7 days for the conjunctivitis.  She does have evidence of an upper respiratory infection given the inflammation of her nasal mucosa and the erythema and injection the posterior oropharynx with clear postnasal drip.  Given the GI component I am concerned she may have COVID or influenza and I will order COVID PCR and rapid flu.  Given her comorbidities of essential hypertension, morbid obesity, hyperlipidemia, NSTEMI, and prediabetes she does qualify for antiviral therapy should she test COVID-positive.  Influenza is negative as is the COVID PCR.  I will discharge patient with diagnosis of conjunctivitis and treated with Vigamox 3 times daily x7 days in her right eye and a viral URI with a cough that I will treat with Atrovent nasal spray, Tessalon Perles, and Promethazine DM cough syrup.  Patient states that she has been able to eat and  drink normally today without have any vomiting so I do not feel Zofran is needed at this time.  Turn precautions reviewed.   Final Clinical Impressions(s) / UC Diagnoses   Final diagnoses:  Acute bacterial conjunctivitis of right eye  Viral URI with cough     Discharge Instructions      Instill 1 drop of Vigamox in each eye every 8 hours for the next 7 days for treatment of your conjunctivitis.  Avoid touching your eyes as much as possible.  Wipe down all surfaces, countertops, and doorknobs after the first and second 24 hours on eyedrops.  Wash her face with a clean wash rag to remove any drainage and use a different portion of the wash rag to clean each eye so as to not reinfect yourself.  Use the Atrovent nasal spray, 2 squirts in each nostril every 6 hours, as needed for runny nose and postnasal drip.  Use the Tessalon Perles every 8 hours during the day.  Take them with a small sip of water.  They may give you some numbness to the base of your tongue or a metallic taste in your mouth, this is normal.  Use the Promethazine DM cough  syrup at bedtime for cough and congestion.  It will make you drowsy so do not take it during the day.  Return for reevaluation for any new or worsening symptoms.      ED Prescriptions     Medication Sig Dispense Auth. Provider   moxifloxacin (VIGAMOX) 0.5 % ophthalmic solution Place 1 drop into both eyes 3 (three) times daily for 7 days. 3 mL Margarette Canada, NP   benzonatate (TESSALON) 100 MG capsule Take 2 capsules (200 mg total) by mouth every 8 (eight) hours. 21 capsule Margarette Canada, NP   ipratropium (ATROVENT) 0.06 % nasal spray Place 2 sprays into both nostrils 4 (four) times daily. 15 mL Margarette Canada, NP   promethazine-dextromethorphan (PROMETHAZINE-DM) 6.25-15 MG/5ML syrup Take 5 mLs by mouth 4 (four) times daily as needed. 118 mL Margarette Canada, NP      PDMP not reviewed this encounter.   Margarette Canada, NP 11/26/21 [phone]

## 2021-11-26 NOTE — Discharge Instructions (Signed)
Instill 1 drop of Vigamox in each eye every 8 hours for the next 7 days for treatment of your conjunctivitis.  Avoid touching your eyes as much as possible.  Wipe down all surfaces, countertops, and doorknobs after the first and second 24 hours on eyedrops.  Wash her face with a clean wash rag to remove any drainage and use a different portion of the wash rag to clean each eye so as to not reinfect yourself.  Use the Atrovent nasal spray, 2 squirts in each nostril every 6 hours, as needed for runny nose and postnasal drip.  Use the Tessalon Perles every 8 hours during the day.  Take them with a small sip of water.  They may give you some numbness to the base of your tongue or a metallic taste in your mouth, this is normal.  Use the Promethazine DM cough syrup at bedtime for cough and congestion.  It will make you drowsy so do not take it during the day.  Return for reevaluation for any new or worsening symptoms.

## 2021-11-26 NOTE — ED Triage Notes (Signed)
Patient presents to [facility] for sore throat, hoarseness,  N/V, diarrhea since Sunday. Right eye drainage since Friday. Treating with benadryl today and theraflu last night.

## 2022-01-15 ENCOUNTER — Ambulatory Visit
Admission: EM | Admit: 2022-01-15 | Discharge: 2022-01-15 | Disposition: A | Discharge status: Home / Self Care | Payer: BC Managed Care – PPO | Attending: Emergency Medicine | Admitting: Emergency Medicine

## 2022-01-15 ENCOUNTER — Encounter: Payer: Self-pay | Admitting: Emergency Medicine

## 2022-01-15 DIAGNOSIS — M7551 Bursitis of right shoulder: Secondary | ICD-10-CM

## 2022-01-15 DIAGNOSIS — M25511 Pain in right shoulder: Secondary | ICD-10-CM | POA: Diagnosis not present

## 2022-01-15 MED ORDER — NAPROXEN 500 MG PO TABS: 500.0000 mg | ORAL_TABLET | Freq: Two times a day (BID) | ORAL | 0 refills | Status: DC

## 2022-01-15 MED ORDER — PREDNISONE 20 MG PO TABS
40.0000 mg | ORAL_TABLET | Freq: Every day | ORAL | 0 refills | Status: AC
Start: 2022-01-15 — End: 2022-01-20

## 2022-01-15 MED ORDER — TIZANIDINE HCL 4 MG PO TABS: 4.0000 mg | ORAL_TABLET | Freq: Three times a day (TID) | ORAL | 0 refills | Status: DC | PRN

## 2022-01-15 NOTE — ED Provider Notes (Signed)
HPI  SUBJECTIVE:  Kimberly Lynn is a right-handed 56 y.o. female who presents with constant, daily, sharp, throbbing, right shoulder joint pain for the past 2 weeks.  She reports arm weakness secondary to pain.  No change in physical activity, fall, trauma to the shoulder, grip weakness.  She denies repetitive activity with this arm.  No bruising, swelling, distal numbness or tingling, neck pain, chest pain, pressure, heaviness shortness of breath.  No antipyretic in the past 6 hours.  She has tried ibuprofen, Tylenol, muscle rub, Aspercreme and Biofreeze, heat and ice without improvement in her symptoms.  Symptoms are worse with abduction laterally,  forward flexion, and with turning her head to the right.  She states that the pain is waking her up at night.  She has a past medical history of NSTEMI, coronary disease, hypertension, elevated LFTs, she states that they were still elevated as of last week.  No history of right shoulder injury, diabetes, chronic kidney disease.  She states that she is not on Eliquis.  PCP: [facility].  Orthopedics: [facility] [place].    Past Medical History:  Diagnosis Date   Anginal pain ([place])    Anxiety    a.) on BZO (lorazepam) PRN   Arthritis    Asthma    Bradycardia    DDD (degenerative disc disease), lumbar    Depression    Diastolic dysfunction 10/19/3005   a.) TTE 12/09/2019 --> performed following NSTEMI that was Dx'd on 12/08/2019: EF >55%, basal inferior HK, G1DD.   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[place])    Nephrolithiasis 2021   NSTEMI (non-ST elevated myocardial infarction) ([place]) 12/08/2019   a.) Tx'd at [facility]; b.) Troponins trended 71 --> 72 --> 56 ng/L; c.) LHC 12/09/2019: EF 55%, LVEDP 17  mmHg, no obstructive CAD   Pre-diabetes     Past Surgical History:  Procedure Laterality Date   ABDOMINAL HYSTERECTOMY     CESAREAN SECTION     COLONOSCOPY WITH PROPOFOL N/A 06/04/2015   Procedure: COLONOSCOPY WITH PROPOFOL;  Surgeon: Manya Silvas, MD;  Location: [place];  Service: Endoscopy;  Laterality: N/A;   ESOPHAGOGASTRODUODENOSCOPY (EGD) WITH PROPOFOL N/A 06/04/2015   Procedure: ESOPHAGOGASTRODUODENOSCOPY (EGD) WITH PROPOFOL;  Surgeon: Manya Silvas, MD;  Location: [facility] ENDOSCOPY;  Service: Endoscopy;  Laterality: N/A;   LAPAROSCOPIC REMOVAL ABDOMINAL MASS     benign   TOTAL KNEE ARTHROPLASTY Right 09/10/2021   Procedure: TOTAL KNEE ARTHROPLASTY;  Surgeon: Corky Mull, MD;  Location: [facility];  Service: Orthopedics;  Laterality: Right;    History reviewed. No pertinent family history.  Social History   Tobacco Use   Smoking status: Every Day    Packs/day: 0.25    Types: Cigarettes   Smokeless tobacco: Never  Vaping Use   Vaping Use: Never used  Substance Use Topics   Alcohol use: No   Drug use: No    No current facility-administered medications for this encounter.  Current Outpatient Medications:    naproxen (NAPROSYN) 500 MG tablet, Take 1 tablet (500 mg total) by mouth 2 (two) times daily., Disp: 20 tablet, Rfl: 0   predniSONE (DELTASONE) 20 MG tablet, Take 2  tablets (40 mg total) by mouth daily with breakfast for 5 days., Disp: 10 tablet, Rfl: 0   tiZANidine (ZANAFLEX) 4 MG tablet, Take 1 tablet (4 mg total) by mouth every 8 (eight) hours as needed for muscle spasms., Disp: 30 tablet, Rfl: 0   acetaminophen (TYLENOL) 650 MG CR tablet, Take 650 mg by mouth every 8 (eight) hours as needed for pain., Disp: , Rfl:    albuterol (VENTOLIN HFA) 108 (90 Base) MCG/ACT inhaler, Inhale 2 puffs into the lungs every 6 (six) hours as needed for wheezing or shortness of breath., Disp: , Rfl:    atenolol (TENORMIN) 50 MG tablet, Take by mouth., Disp: , Rfl:     ezetimibe (ZETIA) 10 MG tablet, Take 10 mg by mouth daily., Disp: , Rfl:    hydrochlorothiazide (HYDRODIURIL) 25 MG tablet, Take 12.5 mg by mouth daily., Disp: , Rfl:    LORazepam (ATIVAN) 1 MG tablet, Take 0.5-1 mg by mouth daily as needed for anxiety., Disp: , Rfl:    losartan (COZAAR) 50 MG tablet, Take 50 mg by mouth daily., Disp: , Rfl:    nitroGLYCERIN (NITROSTAT) 0.4 MG SL tablet, Place under the tongue., Disp: , Rfl:    oxyCODONE (ROXICODONE) 5 MG immediate release tablet, Take 1-2 tablets (5-10 mg total) by mouth every 4 (four) hours as needed for moderate pain or severe pain., Disp: 40 tablet, Rfl: 0   pantoprazole (PROTONIX) 40 MG tablet, Take 1 tablet (40 mg total) by mouth daily., Disp: 30 tablet, Rfl: 1   sertraline (ZOLOFT) 100 MG tablet, Take 100 mg by mouth daily., Disp: , Rfl:   Allergies  Allergen Reactions   Neomycin Itching, Swelling and Rash    Ear drops   Neomycin-Polymyxin-Hc Swelling     ROS  As noted in HPI.   Physical Exam  BP 129/82 (BP Location: Left Arm)   Pulse (!) 103   Temp 97.9 F (36.6 C) (Oral)   Resp 18   SpO2 97%   Constitutional: Well developed, well nourished, no acute distress Eyes:  EOMI, conjunctiva normal bilaterally HENT: Normocephalic, atraumatic,mucus membranes moist Respiratory: Normal inspiratory effort Cardiovascular: Normal rate GI: nondistended skin: No rash, skin intact Musculoskeletal: R  shoulder with ROM normal, Drop test painful but negative,  clavicle NT, A/C joint NT, scapula NT, proximal humerus NT, trapeziu  tender, shoulder joint  tender, Motor strength normal, Sensation intact LT over deltoid region, distal NVI with hand having intact sensation and strength in the median, radial, and ulnar nerve distribution.   Pain  with internal rotation, pain  with external rotation, negative tenderness in bicipital groove, positive  empty can test,  positive  liftoff test,  no instability with abduction/external rotation. RP 2+   Neurologic: Alert & oriented x 3, no focal neuro deficits Psychiatric: Speech and behavior appropriate   ED Course   Medications - No data to display  No orders of the defined types were placed in this encounter.   No results found for this or any previous visit (from the past 24 hour(s)). No results found.  ED Clinical Impression  1. Bursitis of right shoulder   2. Acute pain of right shoulder      ED Assessment/Plan     Suspect either a bursitis or rotator cuff injury.  Suspect more of a bursitis as she denies having any repetitive activity.  Doubt ACS as this is reproducible with palpation and with movement.  We talked about getting an x-ray, but I think it  would be low yield in the absence of trauma.  Doubt fracture, dislocation.  Will send home with Naprosyn 500 mg twice a day, no Tylenol because of the elevated LFTs, prednisone for 5 days, Zanaflex, sleep with a pillow between her body and arm at night, follow-up with her orthopedic surgeon at [facility] clinic if not better in a week to 10 days.  Discussed MDM, treatment plan, and plan for follow-up with patient.  Patient agrees with plan.  Meds ordered this encounter  Medications   naproxen (NAPROSYN) 500 MG tablet    Sig: Take 1 tablet (500 mg total) by mouth 2 (two) times daily.    Dispense:  20 tablet    Refill:  0   tiZANidine (ZANAFLEX) 4 MG tablet    Sig: Take 1 tablet (4 mg total) by mouth every 8 (eight) hours as needed for muscle spasms.    Dispense:  30 tablet    Refill:  0   predniSONE (DELTASONE) 20 MG tablet    Sig: Take 2 tablets (40 mg total) by mouth daily with breakfast for 5 days.    Dispense:  10 tablet    Refill:  0      *This clinic note was created using [employer]. Therefore, there may be occasional mistakes despite careful proofreading.  ?    Melynda Ripple, MD 01/16/22 [phone]

## 2022-01-15 NOTE — ED Triage Notes (Signed)
Pt presents with right shoulder and right arm pain x 2 weeks. Pt denies any injury.

## 2022-01-15 NOTE — Discharge Instructions (Signed)
Naprosyn 500 mg twice a day, no Tylenol, prednisone for 5 days, Zanaflex, sleep with a pillow between your body and arm at night, follow-up with your orthopedic surgeon at [facility] clinic if not better in a week to 10 days.

## 2022-02-06 ENCOUNTER — Other Ambulatory Visit: Payer: Self-pay | Admitting: Otolaryngology

## 2022-02-06 DIAGNOSIS — H9202 Otalgia, left ear: Secondary | ICD-10-CM

## 2022-02-06 DIAGNOSIS — R42 Dizziness and giddiness: Secondary | ICD-10-CM

## 2022-03-04 ENCOUNTER — Ambulatory Visit
Admission: RE | Admit: 2022-03-04 | Discharge: 2022-03-04 | Disposition: A | Discharge status: Home / Self Care | Payer: BC Managed Care – PPO | Source: Ambulatory Visit | Attending: Otolaryngology | Admitting: Otolaryngology

## 2022-03-04 DIAGNOSIS — H9202 Otalgia, left ear: Secondary | ICD-10-CM

## 2022-03-04 DIAGNOSIS — R42 Dizziness and giddiness: Secondary | ICD-10-CM

## 2022-03-04 MED ORDER — GADOPICLENOL 0.5 MMOL/ML IV SOLN
10.0000 mL | Freq: Once | INTRAVENOUS | Status: AC | PRN
Start: 2022-03-04 — End: 2022-03-04
  Administered 2022-03-04: 10 mL via INTRAVENOUS

## 2022-06-25 ENCOUNTER — Other Ambulatory Visit: Payer: Self-pay | Admitting: Surgery

## 2022-07-03 ENCOUNTER — Other Ambulatory Visit: Payer: Self-pay

## 2022-07-03 ENCOUNTER — Encounter
Admission: RE | Admit: 2022-07-03 | Discharge: 2022-07-03 | Disposition: A | Discharge status: Home / Self Care | Payer: BC Managed Care – PPO | Source: Ambulatory Visit | Attending: Surgery | Admitting: Surgery

## 2022-07-03 VITALS — BP 131/79 | HR 65 | Resp 14 | Ht 62.0 in | Wt 255.0 lb

## 2022-07-03 DIAGNOSIS — Z01818 Encounter for other preprocedural examination: Secondary | ICD-10-CM | POA: Insufficient documentation

## 2022-07-03 DIAGNOSIS — Z01810 Encounter for preprocedural cardiovascular examination: Secondary | ICD-10-CM

## 2022-07-03 HISTORY — DX: Personal history of urinary calculi: Z87.442

## 2022-07-03 LAB — COMPREHENSIVE METABOLIC PANEL
ALT: 315 U/L — ABNORMAL HIGH (ref 0–44)
AST: 397 U/L — ABNORMAL HIGH (ref 15–41)
Albumin: 3.9 g/dL (ref 3.5–5.0)
Alkaline Phosphatase: 267 U/L — ABNORMAL HIGH (ref 38–126)
Anion gap: 10 (ref 5–15)
BUN: 17 mg/dL (ref 6–20)
CO2: 26 mmol/L (ref 22–32)
Calcium: 9.5 mg/dL (ref 8.9–10.3)
Chloride: 104 mmol/L (ref 98–111)
Creatinine, Ser: 0.73 mg/dL (ref 0.44–1.00)
GFR, Estimated: >60 mL/min (ref >60)
Glucose, Bld: 98 mg/dL (ref 70–99)
Potassium: 3.7 mmol/L (ref 3.5–5.1)
Sodium: 140 mmol/L (ref 135–145)
Total Bilirubin: 0.8 mg/dL (ref 0.3–1.2)
Total Protein: 7.7 g/dL (ref 6.5–8.1)

## 2022-07-03 LAB — CBC WITH DIFFERENTIAL/PLATELET
Abs Immature Granulocytes: 0.01 10*3/uL (ref 0.00–0.07)
Basophils Absolute: 0 10*3/uL (ref 0.0–0.1)
Basophils Relative: 1 %
Eosinophils Absolute: 0 10*3/uL (ref 0.0–0.5)
Eosinophils Relative: 1 %
HCT: 41.3 % (ref 36.0–46.0)
Hemoglobin: 13.8 g/dL (ref 12.0–15.0)
Immature Granulocytes: 0 %
Lymphocytes Relative: 34 %
Lymphs Abs: 1.3 10*3/uL (ref 0.7–4.0)
MCH: 29.6 pg (ref 26.0–34.0)
MCHC: 33.4 g/dL (ref 30.0–36.0)
MCV: 88.6 fL (ref 80.0–100.0)
Monocytes Absolute: 0.3 10*3/uL (ref 0.1–1.0)
Monocytes Relative: 9 %
Neutro Abs: 2.1 10*3/uL (ref 1.7–7.7)
Neutrophils Relative %: 55 %
Platelets: 211 10*3/uL (ref 150–400)
RBC: 4.66 MIL/uL (ref 3.87–5.11)
RDW: 13.7 % (ref 11.5–15.5)
WBC: 3.7 10*3/uL — ABNORMAL LOW (ref 4.0–10.5)
nRBC: 0 % (ref 0.0–0.2)

## 2022-07-03 LAB — SURGICAL PCR SCREEN
MRSA, PCR: NEGATIVE
Staphylococcus aureus: NEGATIVE

## 2022-07-03 LAB — URINALYSIS, ROUTINE W REFLEX MICROSCOPIC
Bilirubin Urine: NEGATIVE
Glucose, UA: NEGATIVE mg/dL
Hgb urine dipstick: NEGATIVE
Ketones, ur: NEGATIVE mg/dL
Leukocytes,Ua: NEGATIVE
Nitrite: NEGATIVE
Protein, ur: NEGATIVE mg/dL
Specific Gravity, Urine: 1.014 (ref 1.005–1.030)
pH: 6 (ref 5.0–8.0)

## 2022-07-03 LAB — TYPE AND SCREEN
ABO/RH(D): O POS
Antibody Screen: NEGATIVE

## 2022-07-03 NOTE — Progress Notes (Addendum)
  [facility] Perioperative Services: Pre-Admission/Anesthesia Testing  Abnormal Lab Notification   Date: 07/03/22  Name: Kimberly Lynn MRN:   [id number]  Re: Abnormal labs noted during PAT appointment   Notified:  Provider Name Provider Role Notification Mode  Poggi, Excell Seltzer, MD Orthopedics Routed and/or faxed via Kerrin Champagne, Bhumi, PA-C Gastroenterology Routed and/or faxed via [facility]   Clinical Information and Notes:  ABNORMAL LAB VALUE(S):   AST 07/03/2022 397 (H)  15 - 41 U/L Final   ALT 07/03/2022 315 (H)  0 - 44 U/L Final   Alkaline Phosphatase 07/03/2022 267 (H)  38 - 126 U/L Final   [name] is scheduled for a TOTAL KNEE ARTHROPLASTY (Left: Knee) on 07/15/2022. Patient is followed by gastroenterology for a history of chronic transaminitis dating back to 2013. She has had transient more profound elevations of her LFTs.   Serological workup essentially negative. Hepatic US and liver Bx only significant for non-specific inflammation.   Given her upcoming surgery, sending result to primary attending orthopedic provider, as well as gastroenterology, for review.   Quentin Mulling, MSN, APRN, FNP-C, CEN [facility]  Peri-operative Services Nurse Practitioner Phone: [phone] Fax: [phone] 07/03/22 2:54 PM

## 2022-07-03 NOTE — Patient Instructions (Signed)
Your procedure is scheduled on: Tuesday 07/15/22 To find out your arrival time, please call [phone] between 1PM - 3PM on:   Monday 07/14/22 Report to the Registration Desk on the 1st floor of the [facility]. Free Valet parking is available.  If your arrival time is 6:00 am, do not arrive before that time as the [facility] entrance doors do not open until 6:00 am.  REMEMBER: Instructions that are not followed completely may result in serious medical risk, up to and including death; or upon the discretion of your surgeon and anesthesiologist your surgery may need to be rescheduled.  Do not eat food after midnight the night before surgery.  No gum chewing or hard candies.  You may however, drink CLEAR liquids up to 2 hours before you are scheduled to arrive for your surgery. Do not drink anything within 2 hours of your scheduled arrival time.  Clear liquids include: - water  - apple juice without pulp - gatorade (not RED colors) - black coffee or tea (Do NOT add milk or creamers to the coffee or tea) Do NOT drink anything that is not on this list.  Type 1 and Type 2 diabetics should only drink water.  In addition, your doctor has ordered for you to drink the provided:  Gatorade G2 Drinking this carbohydrate drink up to two hours before surgery helps to reduce insulin resistance and improve patient outcomes. Please complete drinking 2 hours before scheduled arrival time.  One week prior to surgery: Stop Anti-inflammatories (NSAIDS) such as Advil, Aleve, Ibuprofen, Motrin, Naproxen, Naprosyn and Aspirin based products such as Excedrin, Goody's Powder, BC Powder, Meloxicam You may however, continue to take Tylenol if needed for pain up until the day of surgery.  Stop ANY OVER THE COUNTER supplements until after surgery.  Continue taking all prescribed medications with the exception of the following: Hold Aspirin for 7 days prior to surgery.  TAKE ONLY THESE MEDICATIONS THE  MORNING OF SURGERY WITH A SIP OF WATER:  allopurinol  amlodipine Atenolol Colchicine Ezetimibe Pantoprazole Antacid (take one the night before and one on the morning of surgery - helps to prevent nausea after surgery.) Sertraline May take allergy medications if needed May take lorazepam if needed  Use inhalers on the day of surgery and bring to the hospital.  No Alcohol for 24 hours before or after surgery.  No Smoking including e-cigarettes for 24 hours before surgery.  No chewable tobacco products for at least 6 hours before surgery.  No nicotine patches on the day of surgery.  Do not use any "recreational" drugs for at least a week (preferably 2 weeks) before your surgery.  Please be advised that the combination of cocaine and anesthesia may have negative outcomes, up to and including death. If you test positive for cocaine, your surgery will be cancelled.  On the morning of surgery brush your teeth with toothpaste and water, you may rinse your mouth with mouthwash if you wish. Do not swallow any toothpaste or mouthwash.  Use CHG Soap or wipes as directed on instruction sheet. Use soap daily for 5 consecutive days starting on Friday 07/11/22  Do not wear lotions, powders, or perfumes on the day of surgery  Do not shave body hair from the neck down 48 hours before surgery.  Wear comfortable clothing (specific to your surgery type) to the hospital.  Do not wear jewelry, make-up, hairpins, clips or nail polish.  Contact lenses, hearing aids and dentures may not be worn  into surgery. Bring your glasses case  Do not bring valuables to the hospital. [facility] is not responsible for any missing/lost belongings or valuables.   Notify your doctor if there is any change in your medical condition (cold, fever, infection).  If you are being discharged the day of surgery, you will not be allowed to drive home. You will need a responsible individual to drive you home and stay with  you for 24 hours after surgery.   If you are taking public transportation, you will need to have a responsible individual with you.  If you are being admitted to the hospital overnight, leave your suitcase in the car. After surgery it may be brought to your room.  In case of increased patient census, it may be necessary for you, the patient, to continue your postoperative care in the Same Day Surgery department.  After surgery, you can help prevent lung complications by doing breathing exercises.  Take deep breaths and cough every 1-2 hours. Your doctor may order a device called an Incentive Spirometer to help you take deep breaths. When coughing or sneezing, hold a pillow firmly against your incision with both hands. This is called "splinting." Doing this helps protect your incision. It also decreases belly discomfort.  Surgery Visitation Policy:  Patients undergoing a surgery or procedure may have two family members or support persons with them as long as the person is not COVID-19 positive or experiencing its symptoms.   Inpatient Visitation:    Visiting hours are 7 a.m. to 8 p.m. Up to four visitors are allowed at one time in a patient room. The visitors may rotate out with other people during the day. One designated support person (adult) may remain overnight.  Please call the Pre-admissions Testing Dept. at [phone] if you have any questions about these instructions.    Pre-operative 5 CHG Bath Instructions   You can play a key role in reducing the risk of infection after surgery. Your skin needs to be as free of germs as possible. You can reduce the number of germs on your skin by washing with CHG (chlorhexidine gluconate) soap before surgery. CHG is an antiseptic soap that kills germs and continues to kill germs even after washing.   DO NOT use if you have an allergy to chlorhexidine/CHG or antibacterial soaps. If your skin becomes reddened or irritated, stop using the CHG  and notify one of our RNs at [phone].   Please shower with the CHG soap starting 4 days before surgery using the following schedule:     Please keep in mind the following:  DO NOT shave, including legs and underarms, starting the day of your first shower.   You may shave your face at any point before/day of surgery.  Place clean sheets on your bed the day you start using CHG soap. Use a clean washcloth (not used since being washed) for each shower. DO NOT sleep with pets once you start using the CHG.   CHG Shower Instructions:  If you choose to wash your hair and private area, wash first with your normal shampoo/soap.  After you use shampoo/soap, rinse your hair and body thoroughly to remove shampoo/soap residue.  Turn the water OFF and apply about 3 tablespoons (45 ml) of CHG soap to a CLEAN washcloth.  Apply CHG soap ONLY FROM YOUR NECK DOWN TO YOUR TOES (washing for 3-5 minutes)  DO NOT use CHG soap on face, private areas, open wounds, or sores.  Pay  special attention to the area where your surgery is being performed.  If you are having back surgery, having someone wash your back for you may be helpful. Wait 2 minutes after CHG soap is applied, then you may rinse off the CHG soap.  Pat dry with a clean towel  Put on clean clothes/pajamas   If you choose to wear lotion, please use ONLY the CHG-compatible lotions on the back of this paper.     Additional instructions for the day of surgery: DO NOT APPLY any lotions, deodorants, cologne, or perfumes.   Put on clean/comfortable clothes.  Brush your teeth.  Ask your nurse before applying any prescription medications to the skin.      CHG Compatible Lotions   Aveeno Moisturizing lotion  Cetaphil Moisturizing Cream  Cetaphil Moisturizing Lotion  Clairol Herbal Essence Moisturizing Lotion, Dry Skin  Clairol Herbal Essence Moisturizing Lotion, Extra Dry Skin  Clairol Herbal Essence Moisturizing Lotion, Normal Skin  Curel  Age Defying Therapeutic Moisturizing Lotion with Alpha Hydroxy  Curel Extreme Care Body Lotion  Curel Soothing Hands Moisturizing Hand Lotion  Curel Therapeutic Moisturizing Cream, Fragrance-Free  Curel Therapeutic Moisturizing Lotion, Fragrance-Free  Curel Therapeutic Moisturizing Lotion, Original Formula  Eucerin Daily Replenishing Lotion  Eucerin Dry Skin Therapy Plus Alpha Hydroxy Crme  Eucerin Dry Skin Therapy Plus Alpha Hydroxy Lotion  Eucerin Original Crme  Eucerin Original Lotion  Eucerin Plus Crme Eucerin Plus Lotion  Eucerin TriLipid Replenishing Lotion  Keri Anti-Bacterial Hand Lotion  Keri Deep Conditioning Original Lotion Dry Skin Formula Softly Scented  Keri Deep Conditioning Original Lotion, Fragrance Free Sensitive Skin Formula  Keri Lotion Fast Absorbing Fragrance Free Sensitive Skin Formula  Keri Lotion Fast Absorbing Softly Scented Dry Skin Formula  Keri Original Lotion  Keri Skin Renewal Lotion Keri Silky Smooth Lotion  Keri Silky Smooth Sensitive Skin Lotion  Nivea Body Creamy Conditioning Oil  Nivea Body Extra Enriched Lotion  Nivea Body Original Lotion  Nivea Body Sheer Moisturizing Lotion Nivea Crme  Nivea Skin Firming Lotion  NutraDerm 30 Skin Lotion  NutraDerm Skin Lotion  NutraDerm Therapeutic Skin Cream  NutraDerm Therapeutic Skin Lotion  ProShield Protective Hand Cream  Provon moisturizing lotion  How to Use an Incentive Spirometer  An incentive spirometer is a tool that measures how well you are filling your lungs with each breath. Learning to take long, deep breaths using this tool can help you keep your lungs clear and active. This may help to reverse or lessen your chance of developing breathing (pulmonary) problems, especially infection. You may be asked to use a spirometer: After a surgery. If you have a lung problem or a history of smoking. After a long period of time when you have been unable to move or be active. If the spirometer  includes an indicator to show the highest number that you have reached, your health care provider or respiratory therapist will help you set a goal. Keep a log of your progress as told by your health care provider. What are the risks? Breathing too quickly may cause dizziness or cause you to pass out. Take your time so you do not get dizzy or light-headed. If you are in pain, you may need to take pain medicine before doing incentive spirometry. It is harder to take a deep breath if you are having pain. How to use your incentive spirometer  Sit up on the edge of your bed or on a chair. Hold the incentive spirometer so that it is in an  upright position. Before you use the spirometer, breathe out normally. Place the mouthpiece in your mouth. Make sure your lips are closed tightly around it. Breathe in slowly and as deeply as you can through your mouth, causing the piston or the ball to rise toward the top of the chamber. Hold your breath for 3-5 seconds, or for as long as possible. If the spirometer includes a coach indicator, use this to guide you in breathing. Slow down your breathing if the indicator goes above the marked areas. Remove the mouthpiece from your mouth and breathe out normally. The piston or ball will return to the bottom of the chamber. Rest for a few seconds, then repeat the steps 10 or more times. Take your time and take a few normal breaths between deep breaths so that you do not get dizzy or light-headed. Do this every 1-2 hours when you are awake. If the spirometer includes a goal marker to show the highest number you have reached (best effort), use this as a goal to work toward during each repetition. After each set of 10 deep breaths, cough a few times. This will help to make sure that your lungs are clear. If you have an incision on your chest or abdomen from surgery, place a pillow or a rolled-up towel firmly against the incision when you cough. This can help to reduce pain  while taking deep breaths and coughing. General tips When you are able to get out of bed: Walk around often. Continue to take deep breaths and cough in order to clear your lungs. Keep using the incentive spirometer until your health care provider says it is okay to stop using it. If you have been in the hospital, you may be told to keep using the spirometer at home. Contact a health care provider if: You are having difficulty using the spirometer. You have trouble using the spirometer as often as instructed. Your pain medicine is not giving enough relief for you to use the spirometer as told. You have a fever. Get help right away if: You develop shortness of breath. You develop a cough with bloody mucus from the lungs. You have fluid or blood coming from an incision site after you cough. Summary An incentive spirometer is a tool that can help you learn to take long, deep breaths to keep your lungs clear and active. You may be asked to use a spirometer after a surgery, if you have a lung problem or a history of smoking, or if you have been inactive for a long period of time. Use your incentive spirometer as instructed every 1-2 hours while you are awake. If you have an incision on your chest or abdomen, place a pillow or a rolled-up towel firmly against your incision when you cough. This will help to reduce pain. Get help right away if you have shortness of breath, you cough up bloody mucus, or blood comes from your incision when you cough. This information is not intended to replace advice given to you by your health care provider. Make sure you discuss any questions you have with your health care provider. Document Revised: 04/04/2019 Document Reviewed: 04/04/2019 Elsevier Patient Education  2023 Elsevier Inc.    Preoperative Educational Videos for Total Hip, Knee and Shoulder Replacements  To better prepare for surgery, please view our videos that explain the physical activity and  discharge planning required to have the best surgical recovery at [facility].  [email or web address]  Questions? Call [phone] or  email [email or web address]

## 2022-07-11 ENCOUNTER — Encounter: Payer: Self-pay | Admitting: Surgery

## 2022-07-11 NOTE — Progress Notes (Signed)
Perioperative / Anesthesia Services  Pre-Admission Testing Clinical Review / Preoperative Anesthesia Consult  Date: 07/11/22  Patient Demographics:  Name: Kimberly Lynn DOB:   [birth year] MRN:   [id number]  Planned Surgical Procedure(s):    Case: [id number] Date/Time: 07/15/22 0715   Procedure: TOTAL KNEE ARTHROPLASTY (Left: Knee)   Anesthesia type: Choice   Pre-op diagnosis: PRIMARY OSTEOARTHRITIS OF LEFT KNEE.   Location: [facility] OR ROOM 03 / [facility]S FOR ANESTHESIA [facility]   Surgeons: Christena Flake, MD     NOTE: Available PAT nursing documentation and vital signs have been reviewed. Clinical nursing staff has updated patient's PMH/PSHx, current medication list, and drug allergies/intolerances to ensure comprehensive history available to assist in medical decision making as it pertains to the aforementioned surgical procedure and anticipated anesthetic course. Extensive review of available clinical information personally performed. [place] PMH and PSHx updated with any diagnoses/procedures that  may have been inadvertently omitted during her intake with the pre-admission testing department's nursing staff.  Clinical Discussion:  Kimberly Lynn is a 57 y.o. female who is submitted for pre-surgical anesthesia review and clearance prior to her undergoing the above procedure. Patient is a Current Smoker. Pertinent PMH includes: CAD, NSTEMI, angina, diastolic dysfunction, bradycardia, HTN, HLD, prediabetes, GERD (on daily PPI), hiatal hernia, asthma, DOE, OA, lumbar DDD, depression, anxiety (on BZO).   Patient is followed by cardiology Kimberly Pares, MD). She was last seen in the cardiology clinic on 07/09/2022; notes reviewed. At the time of her clinic visit, patient doing well overall from a cardiovascular perspective. Patient denied any chest pain, shortness of breath, PND, orthopnea, palpitations, significant peripheral edema, weakness, fatigue, vertiginous symptoms, or presyncope/syncope. Patient with  a past medical history significant for cardiovascular diagnoses. Documented physical exam was grossly benign, providing no evidence of acute exacerbation and/or decompensation of the patient's known cardiovascular conditions.  Patient suffered an NSTEMI on 12/08/2019.  She was treated at [facility].  Troponins were trended: 71 --> 72 --> 56 ng/L.  Diagnostic left heart catheterization was performed on 12/09/2019 revealing a normal left ventricular systolic function with an EF of 55%.  LVEDP mildly elevated at 70 mmHg.  There was no evidence of obstructive CAD noted during the study.   The performed on 12/09/2019 revealed a normal left ventricular systolic function with an EF of >55%.  There was hypokinesis of the basal inferior segments noted. Diastolic Doppler parameters consistent with abnormal relaxation (G1DD).  Right ventricle normal in size with normal systolic function.  There were no significant valvular abnormalities or evidence of increased transvalvular gradients to suggest stenosis.   Stress echocardiogram was performed on 11/23/2020 revealing a normal left ventricular systolic function with an EF of >55%.  RVSF normal.  Study determined to be normal overall.   Blood pressure well controlled at 122/68 mmHg on currently prescribed CCB (amlodipine), beta-blocker (atenolol), diuretic (HCTZ), and ARB (losartan) therapies.  Patient is not currently on any type of lipid-lowering therapies for ASCVD prevention.  She has a prediabetes diagnosis; last HgbA1c was documented at 5.9% on 06/19/2021.  She does not have an OSAH diagnosis. Functional capacity, as defined by DASI, is documented as being >/= 4 METS.  No changes were made to her medication regimen.  Patient to follow-up with outpatient cardiology in 3 to 4 months or sooner if needed.  Kimberly Lynn is scheduled for an TOTAL KNEE ARTHROPLASTY (Left: Knee) on 07/15/2022 with Dr. Leron Croak, MD.  Given patient's past medical history significant for  cardiovascular diagnoses, presurgical cardiac  clearance was sought by the PAT team. Per cardiology, "this patient is optimized for surgery and may proceed with the planned procedural course with a LOW risk of significant perioperative cardiovascular complications".  In review of her medication reconciliation, it is noted that patient is currently on prescribed daily antithrombotic therapy. She has been instructed on recommendations for holding her low dose ASA for 7 days prior to her procedure with plans to restart as soon as postoperative bleeding risk felt to be minimized by her attending surgeon. The patient has been instructed that her last dose of her ASA should be on 07/07/2022.  Patient denies previous perioperative complications with anesthesia in the past. In review of the available records, it is noted that patient underwent a general anesthetic course here at [facility] (ASA III) in 08/2021 without documented complications.      07/03/2022   10:03 AM 01/15/2022    4:56 PM 11/26/2021    1:57 PM  Vitals with BMI  Height 5\' 2"     Weight 255 lbs    BMI 46.63    Systolic 131 129 510  Diastolic 79 82 76  Pulse 65 103 96    Providers/Specialists:   NOTE: Primary physician provider listed below. Patient may have been seen by APP or partner within same practice.   PROVIDER ROLE / SPECIALTY LAST OV  Poggi, Excell Seltzer, MD Orthopedics (Surgeon) 06/16/2022  Gardiner Coins, PA-C Primary Care Provider 02/04/2022  Rudean Hitt, MD Cardiology 07/09/2022  Darcus Austin, PA-C Gastroenterology 04/23/2022  Lottie Rater, MD Rheumatology 04/04/2022   Allergies:  Neomycin and Neomycin-polymyxin-hc  Current Home Medications:   No current facility-administered medications for this encounter.    acetaminophen (TYLENOL) 650 MG CR tablet   albuterol (VENTOLIN HFA) 108 (90 Base) MCG/ACT inhaler   allopurinol (ZYLOPRIM) 100 MG tablet   amLODipine  (NORVASC) 5 MG tablet   aspirin EC 81 MG tablet   atenolol (TENORMIN) 50 MG tablet   cetirizine (ZYRTEC) 10 MG tablet   colchicine 0.6 MG tablet   doxepin (SINEQUAN) 10 MG capsule   ezetimibe (ZETIA) 10 MG tablet   Famotidine (ZANTAC 360 PO)   fexofenadine (ALLEGRA) 180 MG tablet   gabapentin (NEURONTIN) 100 MG capsule   hydrochlorothiazide (HYDRODIURIL) 25 MG tablet   LORazepam (ATIVAN) 1 MG tablet   losartan (COZAAR) 50 MG tablet   meloxicam (MOBIC) 15 MG tablet   naproxen (NAPROSYN) 500 MG tablet   pantoprazole (PROTONIX) 40 MG tablet   sertraline (ZOLOFT) 100 MG tablet   tiZANidine (ZANAFLEX) 2 MG tablet   History:   Past Medical History:  Diagnosis Date   Anginal pain (HCC)    Anxiety    a.) on BZO (lorazepam) PRN   Arthritis    Asthma    Bradycardia    DDD (degenerative disc disease), lumbar    Depression    Diastolic dysfunction 12/09/2019   a.) TTE 12/09/2019 --> performed following NSTEMI that was Dx'd on 12/08/2019: EF >55%, basal inferior HK, G1DD; b.) TTE 02/27/2022: EF >55%, mild LVH, triv PR, mild MR/TR, G1DD   Dyspnea on exertion    Elevated LFTs    External hemorrhoids    GERD (gastroesophageal reflux disease)    Gout    Headache    Hepatic steatosis    Hiatal hernia    History of kidney stones    History of snoring    Hyperlipidemia    Hypertension    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)    Nephrolithiasis 2021   NSTEMI (non-ST elevated myocardial infarction) (HCC) 12/08/2019   a.) Tx'd at [facility]; b.) Troponins trended 71 --> 72 --> 56 ng/L; c.) LHC 12/09/2019: EF 55%, LVEDP 17 mmHg, no obstructive CAD   Pre-diabetes    Past Surgical History:  Procedure Laterality Date   ABDOMINAL HYSTERECTOMY     CARDIAC CATHETERIZATION     CESAREAN SECTION     COLONOSCOPY WITH PROPOFOL N/A 06/04/2015   Procedure: COLONOSCOPY WITH PROPOFOL;  Surgeon: Scot Jun, MD;  Location: [facility] ENDOSCOPY;  Service: Endoscopy;  Laterality: N/A;    ESOPHAGOGASTRODUODENOSCOPY (EGD) WITH PROPOFOL N/A 06/04/2015   Procedure: ESOPHAGOGASTRODUODENOSCOPY (EGD) WITH PROPOFOL;  Surgeon: Scot Jun, MD;  Location: [facility] ENDOSCOPY;  Service: Endoscopy;  Laterality: N/A;   LAPAROSCOPIC REMOVAL ABDOMINAL MASS     benign   TOTAL KNEE ARTHROPLASTY Right 09/10/2021   Procedure: TOTAL KNEE ARTHROPLASTY;  Surgeon: Christena Flake, MD;  Location: [facility] ORS;  Service: Orthopedics;  Laterality: Right;   No family history on file. Social History   Tobacco Use   Smoking status: Some Days    Packs/day: .25    Types: Cigarettes   Smokeless tobacco: Never  Vaping Use   Vaping Use: Never used  Substance Use Topics   Alcohol use: No   Drug use: No    Pertinent Clinical Results:  LABS:   No visits with results within 3 Day(s) from this visit.  Latest known visit with results is:  Hospital Outpatient Visit on 07/03/2022  Component Date Value Ref Range Status   WBC 07/03/2022 3.7 (L)  4.0 - 10.5 K/uL Final   RBC 07/03/2022 4.66  3.87 - 5.11 MIL/uL Final   Hemoglobin 07/03/2022 13.8  12.0 - 15.0 g/dL Final   HCT 16/10/9602 41.3  36.0 - 46.0 % Final   MCV 07/03/2022 88.6  80.0 - 100.0 fL Final   MCH 07/03/2022 29.6  26.0 - 34.0 pg Final   MCHC 07/03/2022 33.4  30.0 - 36.0 g/dL Final   RDW 54/09/8117 13.7  11.5 - 15.5 % Final   Platelets 07/03/2022 211  150 - 400 K/uL Final   nRBC 07/03/2022 0.0  0.0 - 0.2 % Final   Neutrophils Relative % 07/03/2022 55  % Final   Neutro Abs 07/03/2022 2.1  1.7 - 7.7 K/uL Final   Lymphocytes Relative 07/03/2022 34  % Final   Lymphs Abs 07/03/2022 1.3  0.7 - 4.0 K/uL Final   Monocytes Relative 07/03/2022 9  % Final   Monocytes Absolute 07/03/2022 0.3  0.1 - 1.0 K/uL Final   Eosinophils Relative 07/03/2022 1  % Final   Eosinophils Absolute 07/03/2022 0.0  0.0 - 0.5 K/uL Final   Basophils Relative 07/03/2022 1  % Final   Basophils Absolute 07/03/2022 0.0  0.0 - 0.1 K/uL Final   Immature Granulocytes 07/03/2022  0  % Final   Abs Immature Granulocytes 07/03/2022 0.01  0.00 - 0.07 K/uL Final   Performed at [facility], [address] [place], [place] [address]   Sodium 07/03/2022 140  135 - 145 mmol/L Final   Potassium 07/03/2022 3.7  3.5 - 5.1 mmol/L Final   Chloride 07/03/2022 104  98 - 111 mmol/L Final   CO2 07/03/2022 26  22 - 32 mmol/L Final   Glucose, Bld 07/03/2022 98  70 - 99 mg/dL Final   Glucose reference range applies only to samples taken after fasting for at least 8 hours.  BUN 07/03/2022 17  6 - 20 mg/dL Final   Creatinine, Ser 07/03/2022 0.73  0.44 - 1.00 mg/dL Final   Calcium 27/03/5007 9.5  8.9 - 10.3 mg/dL Final   Total Protein 38/18/2993 7.7  6.5 - 8.1 g/dL Final   Albumin 71/69/6789 3.9  3.5 - 5.0 g/dL Final   AST 38/10/1749 397 (H)  15 - 41 U/L Final   ALT 07/03/2022 315 (H)  0 - 44 U/L Final   Alkaline Phosphatase 07/03/2022 267 (H)  38 - 126 U/L Final   Total Bilirubin 07/03/2022 0.8  0.3 - 1.2 mg/dL Final   GFR, Estimated 07/03/2022 >60  >60 mL/min Final   Comment: (NOTE) Calculated using the CKD-EPI Creatinine Equation (2021)    Anion gap 07/03/2022 10  5 - 15 Final   Performed at [facility], [address] [place], [place] [address]   Color, Urine 07/03/2022 YELLOW (A)  YELLOW Final   APPearance 07/03/2022 CLEAR (A)  CLEAR Final   Specific Gravity, Urine 07/03/2022 1.014  1.005 - 1.030 Final   pH 07/03/2022 6.0  5.0 - 8.0 Final   Glucose, UA 07/03/2022 NEGATIVE  NEGATIVE mg/dL Final   Hgb urine dipstick 07/03/2022 NEGATIVE  NEGATIVE Final   Bilirubin Urine 07/03/2022 NEGATIVE  NEGATIVE Final   Ketones, ur 07/03/2022 NEGATIVE  NEGATIVE mg/dL Final   Protein, ur 27/78/2423 NEGATIVE  NEGATIVE mg/dL Final   Nitrite 53/61/4431 NEGATIVE  NEGATIVE Final   Leukocytes,Ua 07/03/2022 NEGATIVE  NEGATIVE Final   Performed at [facility], [address] [place], [place] [address]   ABO/RH(D) 07/03/2022 O POS   Final   Antibody Screen  07/03/2022 NEG   Final   Sample Expiration 07/03/2022 07/17/2022,2359   Final   Extend sample reason 07/03/2022    Final                   Value:NO TRANSFUSIONS OR PREGNANCY IN THE PAST 3 MONTHS Performed at [facility], [address] [place], [place] [address]    MRSA, PCR 07/03/2022 NEGATIVE  NEGATIVE Final   Staphylococcus aureus 07/03/2022 NEGATIVE  NEGATIVE Final   Comment: (NOTE) The Xpert SA Assay (FDA approved for NASAL specimens in patients 56 years of age and older), is one component of a comprehensive surveillance program. It is not intended to diagnose infection nor to guide or monitor treatment. Performed at [facility], [address] [place], [place] [address]     ECG: Date: 07/03/2022 Time ECG obtained: 1105 AM Rate: 51 bpm Rhythm: sinus bradycardia Axis (leads I and aVF): Normal Intervals: PR 192 ms. QRS 92 ms. QTc 471 ms. ST segment and T wave changes: Nonspecific inferior T wave abnormality Comparison: Similar to previous tracing obtained on 09/03/2021   IMAGING / PROCEDURES: MYOCARDIAL PERFUSION IMAGING STUDY (LEXISCAN) performed on 02/27/2022 Normal left ventricular systolic function with a normal LVEF of 58% Normal myocardial thickening and wall motion Left ventricular cavity size normal SPECT images demonstrate homogenous tracer distribution throughout the myocardium No evidence of stress-induced myocardial ischemia or arrhythmia Normal low risk study  TRANSTHORACIC ECHOCARDIOGRAM performed on 02/27/2022 Normal left ventricular systolic function with an EF of greater than 55% No regional wall motion abnormalities Mild LVH Left ventricular diastolic Doppler parameters consistent with abnormal relaxation (G1DD). Trivial PR Mild MR and TR No AR Normal gradients; no valvular stenosis No pericardial effusion  MR BRAIN/IAC W WO CONTRAST performed on 03/04/2022 Unremarkable MRI of the brain and internal auditory  canals  BILATERAL mastoid effusion  ECHO STRESS TEST performed on 11/23/2020 Normal left ventricular systolic function with an a resting and post-rest EF of >55% Normal right ventricular systolic function No significant valvular regurgitation Normal gradients; no valvular stenosis  MR LUMBAR SPINE WO CONTRAST performed on 07/26/2020 Transitional lumbosacral anatomy. Five lumbar type vertebral bodies with partial lumbarization of S1. At L4-L5, new grade 1 anterolisthesis with progressive severe bilateral facet arthropathy. No significant canal or foraminal stenosis. At L5-S1, similar severe right and moderate left facet arthropathy without significant stenosis. Similar disc protrusions at L1-L2 and L2-L3 (detailed above) without significant canal or foraminal stenosis.   LEFT HEART CATHETERIZATION AND CORONARY ANGIOGRAPHY performed on 12/09/2019 Normal left ventricular systolic function with an EF of >55% Mildly elevated left ventricular filling pressures; LVEDP = 17 mmHg No angiographically apparent coronary artery disease Recommendations: risk factor modification  Impression and Plan:  Kimberly Lynn has been referred for pre-anesthesia review and clearance prior to her undergoing the planned anesthetic and procedural courses. Available labs, pertinent testing, and imaging results were personally reviewed by me in preparation for upcoming operative/procedural course. [facility] medical record has been updated following extensive record review and patient interview with PAT staff.   This patient has been appropriately cleared by cardiology with an overall LOW risk of experiencing significant perioperative cardiovascular complications. Based on clinical review performed today (07/11/22), barring any significant acute changes in the patient's overall condition, it is anticipated that she will be able to proceed with the planned surgical intervention. Any acute changes in clinical condition may  necessitate her procedure being postponed and/or cancelled. Patient will meet with anesthesia team (MD and/or CRNA) on the day of her procedure for preoperative evaluation/assessment. Questions regarding anesthetic course will be fielded at that time.   Pre-surgical instructions were reviewed with the patient during her PAT appointment, and questions were fielded to satisfaction by PAT clinical staff. She has been instructed on which medications that she will need to hold prior to surgery, as well as the ones that have been deemed safe/appropriate to take on the day of her procedure. As part of the general education provided by PAT, patient made aware both verbally and in writing, that she would need to abstain from the use of any illegal substances during her perioperative course.  She was advised that failure to follow the provided instructions could necessitate case cancellation or result in serious perioperative complications up to and including death. Patient encouraged to contact PAT and/or her surgeon's office to discuss any questions or concerns that may arise prior to surgery; verbalized understanding.   Quentin Mulling, MSN, APRN, FNP-C, CEN [facility]  Peri-operative Services Nurse Practitioner Phone: [phone] Fax: [phone] 07/11/22 11:18 AM  NOTE: This note has been prepared using Dragon dictation software. Despite my best ability to proofread, there is always the potential that unintentional transcriptional errors may still occur from this process.

## 2022-07-14 MED ORDER — ORAL CARE MOUTH RINSE: 15.0000 mL | Freq: Once | OROMUCOSAL | Status: AC

## 2022-07-14 MED ORDER — LACTATED RINGERS IV SOLN: INTRAVENOUS | Status: DC

## 2022-07-14 MED ORDER — CHLORHEXIDINE GLUCONATE 0.12 % MT SOLN
15.0000 mL | Freq: Once | OROMUCOSAL | Status: AC
  Administered 2022-07-15: 15 mL via OROMUCOSAL

## 2022-07-14 MED ORDER — CEFAZOLIN SODIUM-DEXTROSE 2-4 GM/100ML-% IV SOLN
2.0000 g | INTRAVENOUS | Status: AC
  Administered 2022-07-15: 2 g via INTRAVENOUS

## 2022-07-15 ENCOUNTER — Encounter
Admission: RE | Disposition: A | Discharge status: Home / Self Care | Payer: Self-pay | Source: Home / Self Care | Attending: Surgery

## 2022-07-15 ENCOUNTER — Other Ambulatory Visit: Payer: Self-pay

## 2022-07-15 ENCOUNTER — Ambulatory Visit: Payer: BC Managed Care – PPO | Admitting: Urgent Care

## 2022-07-15 ENCOUNTER — Encounter: Payer: Self-pay | Admitting: Surgery

## 2022-07-15 ENCOUNTER — Ambulatory Visit: Payer: BC Managed Care – PPO

## 2022-07-15 ENCOUNTER — Ambulatory Visit
Admission: RE | Admit: 2022-07-15 | Discharge: 2022-07-15 | Disposition: A | Discharge status: Home / Self Care | Payer: BC Managed Care – PPO | Attending: Surgery | Admitting: Surgery

## 2022-07-15 DIAGNOSIS — Z6841 Body Mass Index (BMI) 40.0 and over, adult: Secondary | ICD-10-CM | POA: Insufficient documentation

## 2022-07-15 DIAGNOSIS — I252 Old myocardial infarction: Secondary | ICD-10-CM | POA: Diagnosis not present

## 2022-07-15 DIAGNOSIS — M1712 Unilateral primary osteoarthritis, left knee: Secondary | ICD-10-CM | POA: Insufficient documentation

## 2022-07-15 DIAGNOSIS — E785 Hyperlipidemia, unspecified: Secondary | ICD-10-CM | POA: Insufficient documentation

## 2022-07-15 DIAGNOSIS — Z79899 Other long term (current) drug therapy: Secondary | ICD-10-CM | POA: Insufficient documentation

## 2022-07-15 DIAGNOSIS — F17210 Nicotine dependence, cigarettes, uncomplicated: Secondary | ICD-10-CM | POA: Insufficient documentation

## 2022-07-15 DIAGNOSIS — I10 Essential (primary) hypertension: Secondary | ICD-10-CM | POA: Insufficient documentation

## 2022-07-15 DIAGNOSIS — J45909 Unspecified asthma, uncomplicated: Secondary | ICD-10-CM | POA: Diagnosis not present

## 2022-07-15 HISTORY — PX: TOTAL KNEE ARTHROPLASTY: SHX125

## 2022-07-15 SURGERY — ARTHROPLASTY, KNEE, TOTAL
Anesthesia: General | Site: Knee | Laterality: Left

## 2022-07-15 MED ORDER — TRANEXAMIC ACID 1000 MG/10ML IV SOLN
INTRAVENOUS | Status: AC
  Filled 2022-07-15: qty 10

## 2022-07-15 MED ORDER — ACETAMINOPHEN 10 MG/ML IV SOLN
INTRAVENOUS | Status: AC
  Filled 2022-07-15: qty 100

## 2022-07-15 MED ORDER — TRIAMCINOLONE ACETONIDE 40 MG/ML IJ SUSP
INTRAMUSCULAR | Status: AC
  Filled 2022-07-15: qty 2

## 2022-07-15 MED ORDER — FENTANYL CITRATE (PF) 100 MCG/2ML IJ SOLN
INTRAMUSCULAR | Status: DC | PRN
  Administered 2022-07-15 (×2): 50 ug via INTRAVENOUS

## 2022-07-15 MED ORDER — PROPOFOL 10 MG/ML IV BOLUS
INTRAVENOUS | Status: AC
  Filled 2022-07-15: qty 20

## 2022-07-15 MED ORDER — FENTANYL CITRATE (PF) 100 MCG/2ML IJ SOLN: 25.0000 ug | INTRAMUSCULAR | Status: DC | PRN

## 2022-07-15 MED ORDER — EPHEDRINE SULFATE (PRESSORS) 50 MG/ML IJ SOLN
INTRAMUSCULAR | Status: DC | PRN
  Administered 2022-07-15: 10 mg via INTRAVENOUS
  Administered 2022-07-15 (×3): 5 mg via INTRAVENOUS

## 2022-07-15 MED ORDER — TRANEXAMIC ACID 1000 MG/10ML IV SOLN
INTRAVENOUS | Status: DC | PRN
  Administered 2022-07-15: 1000 mg via TOPICAL

## 2022-07-15 MED ORDER — MIDAZOLAM HCL 5 MG/5ML IJ SOLN
INTRAMUSCULAR | Status: DC | PRN
  Administered 2022-07-15: 2 mg via INTRAVENOUS

## 2022-07-15 MED ORDER — SODIUM CHLORIDE 0.9 % IR SOLN
Status: DC | PRN
  Administered 2022-07-15: 3000 mL

## 2022-07-15 MED ORDER — ACETAMINOPHEN 10 MG/ML IV SOLN
INTRAVENOUS | Status: DC | PRN
  Administered 2022-07-15: 1000 mg via INTRAVENOUS

## 2022-07-15 MED ORDER — PROPOFOL 500 MG/50ML IV EMUL
INTRAVENOUS | Status: DC | PRN
  Administered 2022-07-15: 75 ug/kg/min via INTRAVENOUS

## 2022-07-15 MED ORDER — APIXABAN 2.5 MG PO TABS: 2.5000 mg | ORAL_TABLET | Freq: Two times a day (BID) | ORAL | 0 refills | Status: AC

## 2022-07-15 MED ORDER — SODIUM CHLORIDE 0.9 % IV BOLUS
250.0000 mL | Freq: Once | INTRAVENOUS | Status: AC
  Administered 2022-07-15: 250 mL via INTRAVENOUS

## 2022-07-15 MED ORDER — PHENYLEPHRINE HCL-NACL 20-0.9 MG/250ML-% IV SOLN
INTRAVENOUS | Status: AC
  Filled 2022-07-15: qty 250

## 2022-07-15 MED ORDER — ONDANSETRON HCL 4 MG/2ML IJ SOLN: 4.0000 mg | Freq: Once | INTRAMUSCULAR | Status: DC | PRN

## 2022-07-15 MED ORDER — BUPIVACAINE HCL (PF) 0.5 % IJ SOLN
INTRAMUSCULAR | Status: DC | PRN
  Administered 2022-07-15: 2.6 mL via INTRATHECAL

## 2022-07-15 MED ORDER — PHENYLEPHRINE HCL-NACL 20-0.9 MG/250ML-% IV SOLN
INTRAVENOUS | Status: DC | PRN
  Administered 2022-07-15: 40 ug/min via INTRAVENOUS

## 2022-07-15 MED ORDER — MIDAZOLAM HCL 2 MG/2ML IJ SOLN
INTRAMUSCULAR | Status: AC
  Filled 2022-07-15: qty 2

## 2022-07-15 MED ORDER — OXYCODONE HCL 5 MG PO TABS: 5.0000 mg | ORAL_TABLET | Freq: Once | ORAL | Status: DC | PRN

## 2022-07-15 MED ORDER — OXYCODONE HCL 5 MG PO TABS: 5.0000 mg | ORAL_TABLET | ORAL | 0 refills | Status: AC | PRN

## 2022-07-15 MED ORDER — CEFAZOLIN SODIUM-DEXTROSE 2-4 GM/100ML-% IV SOLN
INTRAVENOUS | Status: AC
  Filled 2022-07-15: qty 100

## 2022-07-15 MED ORDER — METOCLOPRAMIDE HCL 5 MG/ML IJ SOLN: 5.0000 mg | Freq: Three times a day (TID) | INTRAMUSCULAR | Status: DC | PRN

## 2022-07-15 MED ORDER — LACTATED RINGERS IV SOLN: INTRAVENOUS | Status: DC

## 2022-07-15 MED ORDER — PROPOFOL 1000 MG/100ML IV EMUL
INTRAVENOUS | Status: AC
  Filled 2022-07-15: qty 100

## 2022-07-15 MED ORDER — ACETAMINOPHEN 10 MG/ML IV SOLN: 1000.0000 mg | Freq: Once | INTRAVENOUS | Status: DC | PRN

## 2022-07-15 MED ORDER — OXYCODONE HCL 5 MG PO TABS
5.0000 mg | ORAL_TABLET | ORAL | Status: DC | PRN
  Administered 2022-07-15: 10 mg via ORAL

## 2022-07-15 MED ORDER — SODIUM CHLORIDE 0.9 % IV SOLN: INTRAVENOUS | Status: DC

## 2022-07-15 MED ORDER — CHLORHEXIDINE GLUCONATE 0.12 % MT SOLN
OROMUCOSAL | Status: AC
  Filled 2022-07-15: qty 15

## 2022-07-15 MED ORDER — CEFAZOLIN SODIUM-DEXTROSE 2-4 GM/100ML-% IV SOLN
2.0000 g | Freq: Four times a day (QID) | INTRAVENOUS | Status: DC
  Administered 2022-07-15: 2 g via INTRAVENOUS

## 2022-07-15 MED ORDER — ONDANSETRON HCL 4 MG/2ML IJ SOLN
INTRAMUSCULAR | Status: DC | PRN
  Administered 2022-07-15: 4 mg via INTRAVENOUS

## 2022-07-15 MED ORDER — BUPIVACAINE HCL (PF) 0.5 % IJ SOLN
INTRAMUSCULAR | Status: AC
  Filled 2022-07-15: qty 30

## 2022-07-15 MED ORDER — SODIUM CHLORIDE FLUSH 0.9 % IV SOLN
INTRAVENOUS | Status: AC
  Filled 2022-07-15: qty 20

## 2022-07-15 MED ORDER — SODIUM CHLORIDE (PF) 0.9 % IJ SOLN
INTRAMUSCULAR | Status: AC
  Filled 2022-07-15: qty 50

## 2022-07-15 MED ORDER — PHENYLEPHRINE HCL (PRESSORS) 10 MG/ML IV SOLN
INTRAVENOUS | Status: DC | PRN
  Administered 2022-07-15 (×2): 160 ug via INTRAVENOUS
  Administered 2022-07-15: 80 ug via INTRAVENOUS

## 2022-07-15 MED ORDER — FENTANYL CITRATE (PF) 100 MCG/2ML IJ SOLN
INTRAMUSCULAR | Status: AC
  Filled 2022-07-15: qty 2

## 2022-07-15 MED ORDER — KETOROLAC TROMETHAMINE 30 MG/ML IJ SOLN
30.0000 mg | Freq: Once | INTRAMUSCULAR | Status: AC
  Administered 2022-07-15: 30 mg via INTRAVENOUS

## 2022-07-15 MED ORDER — KETOROLAC TROMETHAMINE 30 MG/ML IJ SOLN
INTRAMUSCULAR | Status: AC
  Filled 2022-07-15: qty 1

## 2022-07-15 MED ORDER — EPHEDRINE 5 MG/ML INJ
INTRAVENOUS | Status: AC
  Filled 2022-07-15: qty 5

## 2022-07-15 MED ORDER — LIDOCAINE HCL (CARDIAC) PF 100 MG/5ML IV SOSY
PREFILLED_SYRINGE | INTRAVENOUS | Status: DC | PRN
  Administered 2022-07-15: 20 mg via INTRAVENOUS

## 2022-07-15 MED ORDER — 0.9 % SODIUM CHLORIDE (POUR BTL) OPTIME
TOPICAL | Status: DC | PRN
  Administered 2022-07-15: 1000 mL

## 2022-07-15 MED ORDER — ONDANSETRON HCL 4 MG PO TABS: 4.0000 mg | ORAL_TABLET | Freq: Four times a day (QID) | ORAL | Status: DC | PRN

## 2022-07-15 MED ORDER — BUPIVACAINE LIPOSOME 1.3 % IJ SUSP
INTRAMUSCULAR | Status: AC
  Filled 2022-07-15: qty 20

## 2022-07-15 MED ORDER — OXYCODONE HCL 5 MG/5ML PO SOLN: 5.0000 mg | Freq: Once | ORAL | Status: DC | PRN

## 2022-07-15 MED ORDER — GLYCOPYRROLATE 0.2 MG/ML IJ SOLN
INTRAMUSCULAR | Status: DC | PRN
  Administered 2022-07-15: .2 mg via INTRAVENOUS

## 2022-07-15 MED ORDER — OXYCODONE HCL 5 MG PO TABS
ORAL_TABLET | ORAL | Status: AC
  Filled 2022-07-15: qty 2

## 2022-07-15 MED ORDER — METOCLOPRAMIDE HCL 10 MG PO TABS: 5.0000 mg | ORAL_TABLET | Freq: Three times a day (TID) | ORAL | Status: DC | PRN

## 2022-07-15 MED ORDER — PHENYLEPHRINE 80 MCG/ML (10ML) SYRINGE FOR IV PUSH (FOR BLOOD PRESSURE SUPPORT)
PREFILLED_SYRINGE | INTRAVENOUS | Status: AC
  Filled 2022-07-15: qty 10

## 2022-07-15 MED ORDER — ONDANSETRON HCL 4 MG/2ML IJ SOLN: 4.0000 mg | Freq: Four times a day (QID) | INTRAMUSCULAR | Status: DC | PRN

## 2022-07-15 MED ORDER — TRIAMCINOLONE ACETONIDE 40 MG/ML IJ SUSP
INTRAMUSCULAR | Status: DC | PRN
  Administered 2022-07-15: 92.5 mL

## 2022-07-15 MED ORDER — PROPOFOL 10 MG/ML IV BOLUS
INTRAVENOUS | Status: DC | PRN
  Administered 2022-07-15 (×2): 40 mg via INTRAVENOUS

## 2022-07-15 MED ORDER — EPINEPHRINE PF 1 MG/ML IJ SOLN
INTRAMUSCULAR | Status: AC
  Filled 2022-07-15: qty 1

## 2022-07-15 MED ORDER — GLYCOPYRROLATE 0.2 MG/ML IJ SOLN
INTRAMUSCULAR | Status: AC
  Filled 2022-07-15: qty 1

## 2022-07-15 MED ORDER — ACETAMINOPHEN 325 MG PO TABS: 325.0000 mg | ORAL_TABLET | Freq: Four times a day (QID) | ORAL | Status: DC | PRN

## 2022-07-15 SURGICAL SUPPLY — 60 items
APL PRP STRL LF DISP 70% ISPRP (MISCELLANEOUS) ×2
BIT DRILL QUICK REL 1/8 2PK SL (BIT) IMPLANT
BLADE SAW SAG 25X90X1.19 (BLADE) ×1 IMPLANT
BLADE SURG SZ20 CARB STEEL (BLADE) ×1 IMPLANT
BNDG CMPR STD VLCR NS LF 5.8X6 (GAUZE/BANDAGES/DRESSINGS) ×1
BNDG ELASTIC 6X5.8 VLCR NS LF (GAUZE/BANDAGES/DRESSINGS) ×1 IMPLANT
BSPLAT TIB 5D E CMNT STM LT (Knees) ×1 IMPLANT
CEMENT BONE R 1X40 (Cement) ×2 IMPLANT
CEMENT VACUUM MIXING SYSTEM (MISCELLANEOUS) ×1 IMPLANT
CHLORAPREP W/TINT 26 (MISCELLANEOUS) ×1 IMPLANT
COMP FEM CEMT PERSONA STD SZ7 (Knees) ×1 IMPLANT
COMPONENT FEM CMT PRSN STD SZ7 (Knees) IMPLANT
COOLER POLAR GLACIER W/PUMP (MISCELLANEOUS) ×1 IMPLANT
COVER MAYO STAND REUSABLE (DRAPES) ×1 IMPLANT
DRAPE 3/4 80X56 (DRAPES) ×1 IMPLANT
DRAPE IMP U-DRAPE 54X76 (DRAPES) ×1 IMPLANT
DRAPE U-SHAPE 47X51 STRL (DRAPES) ×1 IMPLANT
DRSG MEPILEX SACRM 8.7X9.8 (GAUZE/BANDAGES/DRESSINGS) IMPLANT
DRSG OPSITE POSTOP 4X10 (GAUZE/BANDAGES/DRESSINGS) ×1 IMPLANT
DRSG OPSITE POSTOP 4X8 (GAUZE/BANDAGES/DRESSINGS) ×1 IMPLANT
ELECT CAUTERY BLADE 6.4 (BLADE) ×1 IMPLANT
ELECT REM PT RETURN 9FT ADLT (ELECTROSURGICAL) ×1
ELECTRODE REM PT RTRN 9FT ADLT (ELECTROSURGICAL) ×1 IMPLANT
GAUZE XEROFORM 1X8 LF (GAUZE/BANDAGES/DRESSINGS) ×1 IMPLANT
GLOVE BIO SURGEON STRL SZ7.5 (GLOVE) ×4 IMPLANT
GLOVE BIO SURGEON STRL SZ8 (GLOVE) ×4 IMPLANT
GLOVE BIOGEL PI IND STRL 8 (GLOVE) ×1 IMPLANT
GLOVE INDICATOR 8.0 STRL GRN (GLOVE) ×1 IMPLANT
GOWN STRL REUS W/ TWL LRG LVL3 (GOWN DISPOSABLE) ×1 IMPLANT
GOWN STRL REUS W/ TWL XL LVL3 (GOWN DISPOSABLE) ×1 IMPLANT
GOWN STRL REUS W/TWL LRG LVL3 (GOWN DISPOSABLE) ×1
GOWN STRL REUS W/TWL XL LVL3 (GOWN DISPOSABLE) ×1
HANDLE YANKAUER SUCT OPEN TIP (MISCELLANEOUS) ×1 IMPLANT
HOOD PEEL AWAY T7 (MISCELLANEOUS) ×3 IMPLANT
IV NS IRRIG 3000ML ARTHROMATIC (IV SOLUTION) ×1 IMPLANT
KIT TURNOVER KIT A (KITS) ×1 IMPLANT
MANIFOLD NEPTUNE II (INSTRUMENTS) ×1 IMPLANT
NDL SPNL 20GX3.5 QUINCKE YW (NEEDLE) ×1 IMPLANT
NEEDLE SPNL 20GX3.5 QUINCKE YW (NEEDLE) ×1 IMPLANT
NS IRRIG 1000ML POUR BTL (IV SOLUTION) ×1 IMPLANT
PACK TOTAL KNEE (MISCELLANEOUS) ×1 IMPLANT
PAD WRAPON POLAR KNEE (MISCELLANEOUS) ×1 IMPLANT
PENCIL SMOKE EVACUATOR (MISCELLANEOUS) ×1 IMPLANT
PULSAVAC PLUS IRRIG FAN TIP (DISPOSABLE) ×1
STAPLER SKIN PROX 35W (STAPLE) ×1 IMPLANT
STEM POLY PAT PLY 32M KNEE (Knees) IMPLANT
STEM TIBIA 5 DEG SZ E L KNEE (Knees) IMPLANT
STEM TIBIAL SZ6-7 E-F10 (Stem) IMPLANT
SUCTION TUBE FRAZIER 10FR DISP (SUCTIONS) ×1 IMPLANT
SUT VIC AB 0 CT1 36 (SUTURE) ×3 IMPLANT
SUT VIC AB 2-0 CT1 27 (SUTURE) ×3
SUT VIC AB 2-0 CT1 TAPERPNT 27 (SUTURE) ×3 IMPLANT
SYR 10ML LL (SYRINGE) ×1 IMPLANT
SYR 20ML LL LF (SYRINGE) ×1 IMPLANT
SYR 30ML LL (SYRINGE) IMPLANT
TIBIA STEM 5 DEG SZ E L KNEE (Knees) ×1 IMPLANT
TIP FAN IRRIG PULSAVAC PLUS (DISPOSABLE) ×1 IMPLANT
TRAP FLUID SMOKE EVACUATOR (MISCELLANEOUS) ×2 IMPLANT
WATER STERILE IRR 500ML POUR (IV SOLUTION) ×1 IMPLANT
WRAPON POLAR PAD KNEE (MISCELLANEOUS) ×1

## 2022-07-15 NOTE — Anesthesia Procedure Notes (Signed)
Procedure Name: General with mask airway Date/Time: 07/15/2022 7:30 AM  Performed by: Lily Lovings, CRNAPre-anesthesia Checklist: Patient identified, Emergency Drugs available, Suction available, Patient being monitored and Timeout performed Patient Re-evaluated:Patient Re-evaluated prior to induction Oxygen Delivery Method: Simple face mask Preoxygenation: Pre-oxygenation with 100% oxygen Induction Type: IV induction

## 2022-07-15 NOTE — Discharge Instructions (Addendum)
Orthopedic discharge instructions: May shower with intact OpSite dressing. Apply ice frequently to knee or use Polar Care. Start Eliquis 1 tablet (2.5 mg) twice daily on Wednesday, 07/16/2022, for 2 weeks, then take aspirin 325 mg (or baby aspirin x4) twice daily for 4 weeks. Take pain medication as prescribed when needed.  May supplement with ES Tylenol if necessary. May weight-bear as tolerated on left leg - use walker for balance and support. Follow-up in 10-14 days or as scheduled.      AMBULATORY SURGERY  DISCHARGE INSTRUCTIONS   The drugs that you were given will stay in your system until tomorrow so for the next 24 hours you should not:  Drive an automobile Make any legal decisions Drink any alcoholic beverage   You may resume regular meals tomorrow.  Today it is better to start with liquids and gradually work up to solid foods.  You may eat anything you prefer, but it is better to start with liquids, then soup and crackers, and gradually work up to solid foods.   Please notify your doctor immediately if you have any unusual bleeding, trouble breathing, redness and pain at the surgery site, drainage, fever, or pain not relieved by medication.     Your post-operative visit with Dr.                                       is: Date:                        Time:    Please call to schedule your post-operative visit.  Additional Instructions:

## 2022-07-15 NOTE — H&P (Signed)
History of Present Illness: Kimberly Lynn is a 57 y.o.female who is being seen for a new patient visit for left knee pain. The symptoms began many years ago and developed without any specific cause or injury . She reports 5/10 pain in her knee on today's visit. The pain is located along the medial aspect of the knee. The pain is described as aching, dull, stabbing, and throbbing. The symptoms are aggravated with normal daily activities, using stairs, at higher levels of activity, walking, standing, and activity in general. She is unable to reciprocate stairs, and then will have pain at night. She is not using any assistive devices, but has difficulty walking for any length of time. She also describes those where her knee wants to give way on her. She has mild associated swelling and deformity. She has tried acetaminophen, over-the-counter medications, a home exercise program, ice, and heat with limited benefit.  Current Outpatient Medications:  allopurinoL (ZYLOPRIM) 100 MG tablet Take 1 tablet (100 mg total) by mouth once daily for 7 days, THEN 2 tablets (200 mg total) once daily for 7 days, THEN 3 tablets (300 mg total) once daily for 180 days. 561 tablet 0  amLODIPine (NORVASC) 5 MG tablet Take 1 tablet (5 mg total) by mouth once daily 90 tablet 3  aspirin 81 MG EC tablet Take 1 tablet (81 mg total) by mouth once daily 30 tablet 11  atenoloL (TENORMIN) 50 MG tablet TAKE 1 TABLET BY MOUTH EVERY DAY 90 tablet 3  cetirizine (ZYRTEC) 10 MG tablet Take 10 mg by mouth 2 (two) times daily  colchicine (COLCRYS) 0.6 mg tablet Take 1 tablet (0.6 mg total) by mouth once daily for 180 days Take one tablet daily for 5 months. Stop if new diarrhea or rash. 90 tablet 1  ezetimibe (ZETIA) 10 mg tablet TAKE 1 TABLET BY MOUTH EVERY DAY 90 tablet 3  gabapentin (NEURONTIN) 100 MG capsule Take 1 capsule (100 mg total) by mouth at bedtime for 30 doses 30 capsule 1  hydroCHLOROthiazide (HYDRODIURIL) 25 MG tablet Take 1 tablet  (25 mg total) by mouth once daily 90 tablet 3  LORazepam (ATIVAN) 1 MG tablet Take 1 mg by mouth at bedtime as needed  losartan (COZAAR) 50 MG tablet TAKE 1 TABLET BY MOUTH EVERY DAY (Patient taking differently: Take 100 mg by mouth once daily) 90 tablet 3  meloxicam (MOBIC) 15 MG tablet TAKE 1 TABLET (15 MG TOTAL) BY MOUTH ONCE DAILY FOR 30 DAYS 30 tablet 1  pantoprazole (PROTONIX) 40 MG DR tablet TAKE 1 TABLET BY MOUTH EVERY DAY 90 tablet 3  psyllium husk, with sugar, (METAMUCIL) 3.4 gram/12 gram oral powder Take 3.4 g by mouth once daily Mix with a full glass (240 mL) of fluid.  sertraline (ZOLOFT) 100 MG tablet Take 100 mg by mouth once daily  tiZANidine (ZANAFLEX) 2 MG tablet TAKE 1 TABLET BY MOUTH AT BEDTIME AS NEEDED 90 tablet 1  VENTOLIN HFA 90 mcg/actuation inhaler INHALE 2 PUFFS BY MOUTH EVERY 6 HOURS AS NEEDED FOR WHEEZING 18 each 3   Allergies:   Neomycin Acetate Swelling  Ear drops. Face swelling  Neomycin-Polymyxin-Hc Swelling   Past Medical History:  Acute depression 03/23/2015  Acute myocardial infarction of anterolateral wall, initial episode of care (CMS-HCC) 2021  Asthma, unspecified asthma severity, unspecified whether complicated, unspecified whether persistent  GERD (gastroesophageal reflux disease)  History of kidney stones 2021  Last episode 2021, spontaneously passed, no recurrence  History of snoring  Hyperlipidemia  Hypertension  Obesity  Pelvic mass 08/26/2016  Oncology History: - 08/15/16: Evaluated in f/u for GERD and chest pain-- recent EGD with evidence of hiatal hernia and gastritis. Due to elevated LFTs (despite discontinuation of statins) a Liver U/S was ordered. RUQ U/S: Large cystic structure in the abdomen/pelvis, measuring up to 22 cm. (Notably, RUQ U/S on 05/11/15 at [facility] did not note abdominal mass; unclear if it panned down to the abd  Wears glasses   Past Surgical History:  CESAREAN SECTION 1989  HYSTERECTOMY 2007  TLH, BS(?); no  oophorectomy; due to AUB  COLONOSCOPY 07/17/2009 (Adenomatous Polyp, FH Colon Polyps (Father): [facility] 06/2014)  COLONOSCOPY 06/04/2015 (PH Adenomatous Polyp, FH Colon Polyps (Father): [facility] 05/2020)  EGD 06/04/2015 (No repeat per RTE)  LAPAROSCOPIC SALPINGO-OOPHORECTOMY N/A 09/23/2016  Procedure: LAPAROSCOPY, SURGICAL; WITH REMOVAL OF ADNEXAL STRUCTURES (PARTIAL/TOTAL Unilateral OOPHORECTOMY AND Bilateral SALPINGECTOMY); Surgeon: Arvin Collard, MD; Location: [facility] OR; Service: Gynecology; Laterality: N/A;  COLONOSCOPY N/A 03/08/2021 (Procedure: COLORECTAL CANCER SCREENING; COLONOSCOPY ON INDIVIDUAL AT HIGH RISK; Surgeon: Mauri Reading, MD; Location: [facility]; Service: Gastroenterology) Right TKA using all-cemented [employer] V[employer] system with a 65 mm PCR femur, a 71 mm tibial tray with a 12 mm anterior stabilized E-poly insert, and a 31 x 6.2 mm all-poly 3-pegged domed patella Right 09/10/2021 (Dr. Joice Lofts)  HEMORRHOIDECTOMY EXTERNAL   Family History:  High blood pressure (Hypertension) Father  Seizures Sister  Anesthesia problems Neg Hx  Malignant hypertension Neg Hx  Malignant hyperthermia Neg Hx  Pseudochol deficiency Neg Hx  Coronary Artery Disease (Blocked arteries around heart) Neg Hx  Myocardial Infarction (Heart attack) Neg Hx  Sudden death (unexpected death due to unknown cause) Neg Hx   Social History:   Socioeconomic History:  Marital status: Married  Spouse name: Clifton Custard Chopra  Number of children: 1  Occupational History  Occupation: stay at home  Tobacco Use  Smoking status: Some Days  Current packs/day: 0.00  Average packs/day: 0.3 packs/day for 10.0 years (2.5 ttl pk-yrs)  Types: Cigarettes  Start date: 08/28/2006  Last attempt to quit: 08/27/2016  Years since quitting: 5.8  Smokeless tobacco: Never  Tobacco comments:  5 cigarettes per week  Vaping Use  Vaping status: Never Used  Substance and Sexual Activity  Alcohol use: Not Currently  Drug  use: No  Sexual activity: Yes  Partners: Male  Social History Narrative  arried, lives with husband and daughter, born 70. One year of college. Works in Presenter, broadcasting at [employer]. No exercise.   Social Determinants of Health:   Financial Resource Strain: High Risk (11/22/2020)  Overall Financial Resource Strain (CARDIA)  Difficulty of Paying Living Expenses: Very hard  Food Insecurity: No Food Insecurity (12/13/2021)  Hunger Vital Sign  Worried About Running Out of Food in the Last Year: Never true  Ran Out of Food in the Last Year: Never true  Transportation Needs: No Transportation Needs (12/13/2021)  PRAPARE - Risk analyst (Medical): No  Lack of Transportation (Non-Medical): No   Review of Systems:  A comprehensive 14 point ROS was performed, reviewed, and the pertinent orthopaedic findings are documented in the HPI.  Physical Exam: Vitals:  06/16/22 0845  BP: 124/78  Weight: (!) 116.5 kg (256 lb 12.8 oz)  Height: 157.5 cm (5\' 2" )  PainSc: 5  PainLoc: Knee   General/Constitutional: Pleasant significantly overweight middle-aged female in no acute distress. Neuro/Psych: Normal mood and affect, oriented to person, place and time. Eyes: Non-icteric. Pupils are equal, round, and reactive  to light, and exhibit synchronous movement. Lymphatic: No palpable adenopathy. Respiratory: Lungs clear to auscultation, Normal chest excursion, No wheezes, and Non-labored breathing Cardiovascular: Regular rate and rhythm. No murmurs. and No edema, swelling or tenderness, except as noted in detailed exam. Vascular: No edema, swelling or tenderness, except as noted in detailed exam. Integumentary: No impressive skin lesions present, except as noted in detailed exam. Musculoskeletal: Unremarkable, except as noted in detailed exam.  Left knee exam: GAIT: mild limp and uses no assistive devices. ALIGNMENT: moderate varus SKIN: unremarkable SWELLING:  minimal EFFUSION: trace WARMTH: no warmth TENDERNESS: moderate numbness along medial joint line, mild tenderness along lateral joint line ROM: 0 to 105 degrees with pain in maximal flexion McMURRAY'S: equivocal PATELLOFEMORAL: normal tracking with no peri-patellar tenderness and negative apprehension sign CREPITUS: Mild patellofemoral crepitance LACHMAN'S: negative PIVOT SHIFT: negative ANTERIOR DRAWER: negative POSTERIOR DRAWER: negative VARUS/VALGUS: positive pseudolaxity to varus stressing  She is neurovascularly intact to the left lower extremity and foot.  Knee Imaging: AP weightbearing of both knees, as well as lateral and merchant views of the left knee are obtained. These films demonstrate severe degenerative changes, primarily involving the medial compartment with 100% medial joint space narrowing. Overall alignment is moderate varus. No fractures, lytic lesions, or abnormal calcifications are noted.  Assessment:  1. Primary osteoarthritis of left knee.   Plan: The treatment options were discussed with the patient. In addition, patient educational materials were provided regarding the diagnosis and treatment options. The patient is quite frustrated by her symptoms and functional limitations, and is ready to consider more aggressive treatment options. Therefore, I have recommended a surgical procedure, specifically a left total knee arthroplasty. The procedure was discussed with the patient, as were the potential risks (including bleeding, infection, nerve and/or blood vessel injury, persistent or recurrent pain, loosening and/or failure of the components, dislocation, need for further surgery, blood clots, strokes, heart attacks and/or arhythmias, pneumonia, etc.) and benefits. The patient states his/her understanding and wishes to proceed. All of the patient's questions and concerns were answered. She can call any time with further concerns. She will follow up post-surgery, routine.     H&P reviewed and patient re-examined. No changes.

## 2022-07-15 NOTE — Anesthesia Preprocedure Evaluation (Addendum)
Anesthesia Evaluation  Patient identified by MRN, date of birth, ID band Patient awake    Reviewed: Allergy & Precautions, NPO status , Patient's Chart, lab work & pertinent test results  History of Anesthesia Complications Negative for: history of anesthetic complications  Airway Mallampati: III   Neck ROM: Full    Dental  (+) Missing   Pulmonary asthma , Current Smoker (3-4 cigarettes per week) and Patient abstained from smoking.   Pulmonary exam normal breath sounds clear to auscultation       Cardiovascular hypertension, + Past MI  Normal cardiovascular exam Rhythm:Regular Rate:Normal  ECG 07/03/22:  Sinus bradycardia Nonspecific T wave abnormality  Echocardiogram 2D complete: (02/27/2022) INTERPRETATION NORMAL LEFT VENTRICULAR SYSTOLIC FUNCTION WITH MILD LVH NORMAL RIGHT VENTRICULAR SYSTOLIC FUNCTION MILD VALVULAR REGURGITATION (See above) NO VALVULAR STENOSIS MILD MR, TR TRIVIAL PR EF >55%  NM Myocardial Perfusion SPECT multiple (stress and rest): (02/27/2022) IMPRESSION: 1. Normal left ventricular function 2. Normal wall motion 3. No evidence for scar or ischemia     Neuro/Psych  Headaches PSYCHIATRIC DISORDERS Anxiety Depression       GI/Hepatic hiatal hernia,GERD  ,,  Endo/Other  Class 3 obesity; prediabetes  Renal/GU Renal disease (nephrolithiasis)     Musculoskeletal  (+) Arthritis ,  Gout    Abdominal   Peds  Hematology negative hematology ROS (+)   Anesthesia Other Findings Reviewed and agree with Edd Fabian pre-anesthesia clinical review note.    Cardiology note 07/09/22:  57 y.o. female with  1. Preoperative clearance  2. H/O non-ST elevation myocardial infarction (NSTEMI)  3. Essential hypertension  4. Mixed hyperlipidemia  5. Obesity, Class III, BMI 40-49.9 (morbid obesity) (CMS/HHS-HCC)   Plan   Preoperative clearance, patient is cleared from a cardiac standpoint for left knee  replacement. Patient may hold aspirin 7 days beforehand.  H/o NSTEMI, in 2021 at [facility] with cardiac catheterization on 12/09/2019 showing no apparent coronary disease. Continue aspirin and Zetia.  Hypertension, well controlled, continue current medical therapy with losartan, atenolol, HCTZ, and amlodipine.  Hyperlipidemia, continue Zetia Obesity, recommend weight loss, exercise, and portion control Have patient follow up in 6 months  Return in about 6 months (around 01/08/2023).    Reproductive/Obstetrics Ovarian CA                             Anesthesia Physical Anesthesia Plan  ASA: 3  Anesthesia Plan: General and Spinal   Post-op Pain Management:    Induction: Intravenous  PONV Risk Score and Plan: 2 and Propofol infusion, TIVA, Treatment may vary due to age or medical condition and Ondansetron  Airway Management Planned: Natural Airway and Nasal Cannula  Additional Equipment:   Intra-op Plan:   Post-operative Plan:   Informed Consent: I have reviewed the patients History and Physical, chart, labs and discussed the procedure including the risks, benefits and alternatives for the proposed anesthesia with the patient or authorized representative who has indicated his/her understanding and acceptance.       Plan Discussed with: CRNA  Anesthesia Plan Comments: (Plan for spinal and GA with natural airway, LMA/GETA backup.  Patient consented for risks of anesthesia including but not limited to:  - adverse reactions to medications - damage to eyes, teeth, lips or other oral mucosa - nerve damage due to positioning  - sore throat or hoarseness - headache, bleeding, infection, nerve damage 2/2 spinal - damage to heart, brain, nerves, lungs, other parts of body or loss  of life  Informed patient about role of CRNA in peri- and intra-operative care.  Patient voiced understanding.)        Anesthesia Quick Evaluation

## 2022-07-15 NOTE — Op Note (Signed)
07/15/2022  10:46 AM  Patient:   Kimberly Lynn  Pre-Op Diagnosis:   Degenerative joint disease, left knee.  Post-Op Diagnosis:   Same  Procedure:   Left TKA using all-cemented [employer] Persona system with a #7 PCR femur, a(n) E-sized  tibial tray with a 10 mm E-poly insert, and a 32 x 8.5 mm all-poly 3-pegged domed patella.  Surgeon:   Maryagnes Amos, MD  Assistant:   Horris Latino, PA-C   Anesthesia:   GET  Findings:   As above  Complications:   None  EBL:   10 cc  Fluids:   1000 cc crystalloid  UOP:   None  TT:   110 minutes at 300 mmHg  Drains:   None  Closure:   Staples  Implants:   As above  Brief Clinical Note:   The patient is a 57 year old female with a long history of progressively worsening left knee pain. The patient's symptoms have progressed despite medications, activity modification, injections, etc. The patient's history and examination were consistent with advanced degenerative joint disease of the left knee confirmed by plain radiographs. The patient presents at this time for a left total knee arthroplasty.  Procedure:   The patient was brought into the operating room . After adequate spinal anesthesia was obtained, the patient was repositioned in the supine position on the operating room table. The left lower extremity was prepped with ChloraPrep solution and draped sterilely. Preoperative antibiotics were administered. A timeout was performed to verify the appropriate surgical site before the limb was exsanguinated with an Esmarch and the tourniquet inflated to 300 mmHg.   A standard anterior approach to the knee was made through an approximately 6-7 inch incision. The incision was carried down through the subcutaneous tissues to expose superficial retinaculum. This was split the length of the incision and the medial flap elevated sufficiently to expose the medial retinaculum. The medial retinaculum was incised, leaving a 3-4 mm cuff of tissue on the patella.  This was extended distally along the medial border of the patellar tendon and proximally through the medial third of the quadriceps tendon. A subtotal fat pad excision was performed before the soft tissues were elevated off the anteromedial and anterolateral aspects of the proximal tibia to the level of the collateral ligaments. The anterior portions of the medial and lateral menisci were removed, as was the anterior cruciate ligament. With the knee flexed to 90, the external tibial guide was positioned and the appropriate proximal tibial cut made. This piece was taken to the back table where it was measured and found to be optimally replicated by a(n) E-sized component.  Attention was directed to the distal femur. The intramedullary canal was accessed through a 3/8" drill hole. The intramedullary guide was inserted and positioned in order to obtain a neutral flexion gap. The distal cutting block was placed at 5 of valgus alignment. Using the +0 slot, the distal cut was made. The distal femur was measured and found to be optimally replicated by the #7 component. The #7 4-in-1 cutting block was positioned and first the posterior, then the posterior chamfer, the anterior chamfer, and finally the anterior cuts were made after verifying that the anterior cortex would not be notched.   At this point, the posterior portions medial and lateral menisci were removed. A trial reduction was performed using the appropriate femoral and tibial components with the 10 mm insert. This demonstrated excellent stability to varus and valgus stressing both in flexion and extension while  permitting full extension. Patella tracking was assessed and found to be excellent. Therefore, the tibial trial position was marked on the proximal tibia. The patella thickness was measured and found to be 25 mm. Therefore, the appropriate cut was made. The patellar surface was measured and found to be optimally replicated by the 32 mm component.  The three peg holes were drilled in place before the trial button was inserted. Patella tracking was assessed and found to be excellent, passing the "no thumb test". The lug holes were drilled into the distal femur before the trial component was removed.  The tibial tray was repositioned before the keel was created using the appropriate tower, reamer, and punch.  The bony surfaces were prepared for cementing by irrigating them thoroughly with sterile saline solution via the jet lavage system. A bone plug was fashioned from some of the bone that had been removed previously and used to plug the distal femoral canal. In addition, a "cocktail" of 20 cc of Exparel, 30 cc of 0.5% Sensorcaine, 2 cc of Kenalog 40 (80 mg), and 30 mg of Toradol diluted out to 90 cc with normal saline was injected into the postero-medial and postero-lateral aspects of the knee, the medial and lateral gutter regions, and the peri-incisional tissues to help with postoperative analgesia. Meanwhile, the cement was being mixed on the back table.   When the cement was ready, the tibial tray was cemented in first. The excess cement was removed using Personal assistant. Next, the femoral component was impacted into place. Again, the excess cement was removed using Personal assistant. The 10 mm trial insert was positioned and the knee brought into extension while the cement hardened. Finally, the patella was cemented into place and secured using the patellar clamp. Again, the excess cement was removed using Personal assistant. Once the cement had hardened, the knee was placed through a range of motion with the findings as described above. Therefore, the trial insert was removed and, after verifying that no cement had been retained posteriorly, the permanent 10 mm anterior stabilized E-polyethylene insert was positioned and secured using the appropriate key locking mechanism. Again the knee was placed through a range of motion with the findings as described  above.  The wound was copiously irrigated with sterile saline solution using the jet lavage system before the quadriceps tendon and retinacular layer were reapproximated using #0 Vicryl interrupted sutures. The superficial retinacular layer also was closed using a running #0 Vicryl suture. A total of 10 cc of transexemic acid (TXA) was injected intra-articularly before the subcutaneous tissues were closed in several layers using 2-0 Vicryl interrupted sutures. The skin was closed using staples. A sterile honeycomb dressing was applied to the skin before the leg was wrapped with an Ace wrap to accommodate the Polar Care device. The patient was then awakened, extubated, and returned to the recovery room in satisfactory condition after tolerating the procedure well.

## 2022-07-15 NOTE — Progress Notes (Signed)
Patient has been doing great, patient passed physical therapy, pain is controlled, and she has voided on her own. Patient discharge instructions given and all questions answered to patient and spouse satisfaction.

## 2022-07-15 NOTE — Anesthesia Postprocedure Evaluation (Signed)
Anesthesia Post Note  Patient: Kimberly Lynn  Procedure(s) Performed: TOTAL KNEE ARTHROPLASTY (Left: Knee)  Patient location during evaluation: PACU Anesthesia Type: Spinal Level of consciousness: awake and alert, oriented and patient cooperative Pain management: pain level controlled Vital Signs Assessment: post-procedure vital signs reviewed and stable Respiratory status: spontaneous breathing, nonlabored ventilation and respiratory function stable Cardiovascular status: blood pressure returned to baseline and stable Postop Assessment: adequate PO intake, no headache, no backache and spinal receding Anesthetic complications: no   No notable events documented.   Last Vitals:  Vitals:   07/15/22 1145 07/15/22 1155  BP: 114/75 116/71  Pulse: 74 62  Resp: 16 15  Temp: 36.4 C 36.7 C  SpO2: 99% 97%    Last Pain:  Vitals:   07/15/22 1155  TempSrc: Temporal  PainSc: 0-No pain                 Reed Breech

## 2022-07-15 NOTE — TOC Progression Note (Signed)
Transition of Care [facility]) - Progression Note    Patient Details  Name: Kimberly Lynn MRN: [id number] Date of Birth: 1965-09-16  Transition of Care [facility]) CM/SW Contact  Marlowe Sax, RN Phone Number: 07/15/2022, 11:17 AM  Clinical Narrative:    Reached out to [facility] and confirmed that they have The patient set up for [facility] prior to surgery by the Surgeons office They confirmed that yes that are set up for [facility]        Expected Discharge Plan and Services         Expected Discharge Date: 07/15/22                                     Social Determinants of Health (SDOH) Interventions SDOH Screenings   Tobacco Use: High Risk (07/15/2022)    Readmission Risk Interventions     No data to display

## 2022-07-15 NOTE — Anesthesia Procedure Notes (Signed)
Spinal  Patient location during procedure: OR Start time: 07/15/2022 7:38 AM End time: 07/15/2022 7:49 AM Reason for block: surgical anesthesia Staffing Performed: anesthesiologist  Anesthesiologist: Reed Breech, MD Resident/CRNA: Lily Lovings, CRNA Performed by: Reed Breech, MD Authorized by: Reed Breech, MD   Preanesthetic Checklist Completed: patient identified, IV checked, site marked, risks and benefits discussed, surgical consent, monitors and equipment checked, pre-op evaluation and timeout performed Spinal Block Patient position: sitting Prep: ChloraPrep Patient monitoring: heart rate, cardiac monitor, continuous pulse ox and blood pressure Approach: midline Location: L2-3 Injection technique: single-shot Needle Needle type: Sprotte  Needle gauge: 24 G Needle length: 12.7 cm Assessment Sensory level: T4 Events: CSF return and second provider Additional Notes Initial attempt by Kennedy Bucker at L3-4 met with os at each pass.  Attempt by [facility] at L2-3 successful with longer needle length.

## 2022-07-15 NOTE — Evaluation (Signed)
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 s/p L TKA. PMH of HTN, CAD, NSTEMI, angina, HLD, GERD, asthma, current smoker, depression, anxiety.  Clinical Impression  Pt is upright in bed with nursing and husband present. Pt reports 2/10 L knee pain, worsened to 6/10 with mobility, 4/10 at end of session. PTA pt was independent for all mobility and ADL's, lives with husband. Pt was able to complete bed mobility c/ supervision, transfers c/ CGA and RW, ambulation c/ RW and CGA progressing to supervision. Pt able to navigate 4 steps up/down c/ R handrail and CGA with cueing for proper sequencing. Pt and husband informed of precautions. Pt left in chair, needs within reach, and husband and nursing in room. Pt would benefit from skilled PT services to improve mobility, pain, and functional activity tolerance.       Recommendations for follow up therapy are one component of a multi-disciplinary discharge planning process, led by the attending physician.  Recommendations may be updated based on patient status, additional functional criteria and insurance authorization.  Follow Up Recommendations       Assistance Recommended at Discharge PRN  Patient can return home with the following  A little help with bathing/dressing/bathroom;A little help with walking and/or transfers;Assist for transportation;Help with stairs or ramp for entrance    Equipment Recommendations None recommended by PT  Recommendations for Other Services       Functional Status Assessment Patient has had a recent decline in their functional status and demonstrates the ability to make significant improvements in function in a reasonable and predictable amount of time.     Precautions / Restrictions Precautions Precautions: Fall;Knee Precaution Booklet Issued: Yes  (comment) Precaution Comments: TKA booklet issues and reviewed with pt and husband Restrictions Weight Bearing Restrictions: Yes LLE Weight Bearing: Weight bearing as tolerated      Mobility  Bed Mobility Overal bed mobility: Needs Assistance Bed Mobility: Supine to Sit     Supine to sit: Supervision, HOB elevated          Transfers Overall transfer level: Needs assistance Equipment used: Rolling walker (2 wheels) Transfers: Sit to/from Stand Sit to Stand: Min guard                Ambulation/Gait Ambulation/Gait assistance: Min guard, Supervision Gait Distance (Feet): 80 Feet Assistive device: Rolling walker (2 wheels) Gait Pattern/deviations: Step-through pattern, Antalgic, Decreased stride length          Stairs Stairs: Yes Stairs assistance: Min guard Stair Management: One rail Right Number of Stairs: 4    Wheelchair Mobility    Modified Rankin (Stroke Patients Only)       Balance Overall balance assessment: Needs assistance Sitting-balance support: Feet supported, No upper extremity supported Sitting balance-Leahy Scale: Good     Standing balance support: Bilateral upper extremity supported Standing balance-Leahy Scale: Fair                               Pertinent Vitals/Pain Pain Assessment Pain Assessment: 0-10 Pain Score: 6  Pain Location: L knee Pain Descriptors / Indicators: Discomfort, Dull Pain Intervention(s): Limited activity within patient's tolerance, Monitored during session, Premedicated before session, Repositioned, Ice applied    Home Living Family/patient expects to be discharged to:: Private residence Living Arrangements: Spouse/significant other;Children Available Help at Discharge: Family  Type of Home: House Home Access: Stairs to enter Entrance Stairs-Rails: Right Entrance Stairs-Number of Steps: 8   Home Layout: One level Home Equipment: Rolling Walker (2 wheels);BSC/3in1;Cane - single  point;Shower seat;Grab bars - tub/shower;Toilet riser      Prior Function Prior Level of Function : Independent/Modified Independent;Driving             Mobility Comments: independent ADLs Comments: independent     Hand Dominance        Extremity/Trunk Assessment   Upper Extremity Assessment Upper Extremity Assessment: Overall WFL for tasks assessed    Lower Extremity Assessment Lower Extremity Assessment: LLE deficits/detail LLE Deficits / Details: s/p L TKA LLE Sensation: decreased light touch       Communication   Communication: No difficulties  Cognition Arousal/Alertness: Awake/alert Behavior During Therapy: WFL for tasks assessed/performed Overall Cognitive Status: Within Functional Limits for tasks assessed                                          General Comments      Exercises General Exercises - Lower Extremity Hip Flexion/Marching: AROM, Both, 10 reps, Seated Toe Raises: AROM, Both, 10 reps, Seated Heel Raises: AROM, Both, 10 reps, Seated   Assessment/Plan    PT Assessment Patient needs continued PT services  PT Problem List Decreased strength;Decreased range of motion;Decreased knowledge of use of DME;Decreased activity tolerance;Decreased balance;Decreased knowledge of precautions;Decreased mobility;Decreased coordination;Pain;Impaired sensation       PT Treatment Interventions DME instruction;Gait training;Neuromuscular re-education;Stair training;Functional mobility training;Patient/family education;Therapeutic activities;Therapeutic exercise;Balance training    PT Goals (Current goals can be found in the Care Plan section)  Acute Rehab PT Goals Patient Stated Goal: to return to home PT Goal Formulation: With patient Time For Goal Achievement: 07/29/22 Potential to Achieve Goals: Good    Frequency BID     Co-evaluation               AM-PAC PT "6 Clicks" Mobility  Outcome Measure Help needed turning from your  back to your side while in a flat bed without using bedrails?: None Help needed moving from lying on your back to sitting on the side of a flat bed without using bedrails?: None Help needed moving to and from a bed to a chair (including a wheelchair)?: None Help needed standing up from a chair using your arms (e.g., wheelchair or bedside chair)?: None Help needed to walk in hospital room?: None Help needed climbing 3-5 steps with a railing? : A Little 6 Click Score: 23    End of Session Equipment Utilized During Treatment: Gait belt Activity Tolerance: Patient tolerated treatment well Patient left: in chair;with call bell/phone within reach;with family/visitor present Nurse Communication: Mobility status PT Visit Diagnosis: Unsteadiness on feet (R26.81);Muscle weakness (generalized) (M62.81);Pain Pain - Right/Left: Left Pain - part of body: Knee    Time: 1610-9604 PT Time Calculation (min) (ACUTE ONLY): 27 min   Charges:             Lala Lund, PT, SPT  3:01 PM,07/15/22

## 2022-07-15 NOTE — Transfer of Care (Signed)
Immediate Anesthesia Transfer of Care Note  Patient: Kimberly Lynn  Procedure(s) Performed: TOTAL KNEE ARTHROPLASTY (Left: Knee)  Patient Location: PACU  Anesthesia Type:General and Spinal  Level of Consciousness: awake and patient cooperative  Airway & Oxygen Therapy: Patient Spontanous Breathing and Patient connected to face mask oxygen  Post-op Assessment: Report given to RN and Patient moving all extremities  Post vital signs: Reviewed and stable  Last Vitals:  Vitals Value Taken Time  BP 102/63 07/15/22 1018  Temp    Pulse 79 07/15/22 1020  Resp 18 07/15/22 1020  SpO2 100 % 07/15/22 1020  Vitals shown include unvalidated device data.  Last Pain:  Vitals:   07/15/22 0615  TempSrc: Temporal  PainSc: 3          Complications: No notable events documented.

## 2022-11-13 ENCOUNTER — Encounter: Payer: Self-pay | Admitting: Podiatry

## 2022-11-26 ENCOUNTER — Ambulatory Visit: Payer: BC Managed Care – PPO | Admitting: Podiatry
